# Patient Record
Sex: Male | Born: 2000 | Race: Black or African American | Hispanic: No | Marital: Single | Smoking: Never smoker
Health system: Southern US, Community
[De-identification: ages and names within clinical notes are randomized; demographics above are authoritative.]

## PROBLEM LIST (undated history)

## (undated) DIAGNOSIS — F909 Attention-deficit hyperactivity disorder, unspecified type: Secondary | ICD-10-CM

## (undated) HISTORY — DX: Attention-deficit hyperactivity disorder, unspecified type: F90.9

---
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
## 2020-02-25 ENCOUNTER — Emergency Department (HOSPITAL_COMMUNITY)
Admission: EM | Admit: 2020-02-25 | Discharge: 2020-02-25 | Disposition: A | Discharge status: Home / Self Care | Payer: No Typology Code available for payment source | Attending: Emergency Medicine | Admitting: Emergency Medicine

## 2020-02-25 ENCOUNTER — Emergency Department (HOSPITAL_COMMUNITY): Payer: No Typology Code available for payment source

## 2020-02-25 ENCOUNTER — Encounter (HOSPITAL_COMMUNITY): Payer: Self-pay | Admitting: Emergency Medicine

## 2020-02-25 ENCOUNTER — Other Ambulatory Visit: Payer: Self-pay

## 2020-02-25 DIAGNOSIS — U071 COVID-19: Secondary | ICD-10-CM | POA: Diagnosis not present

## 2020-02-25 DIAGNOSIS — R059 Cough, unspecified: Secondary | ICD-10-CM | POA: Diagnosis present

## 2020-02-25 LAB — SARS CORONAVIRUS 2 BY RT PCR (HOSPITAL ORDER, PERFORMED IN ~~LOC~~ HOSPITAL LAB): SARS Coronavirus 2: POSITIVE — AB

## 2020-02-25 MED ORDER — BENZONATATE 100 MG PO CAPS: 100.0000 mg | ORAL_CAPSULE | Freq: Three times a day (TID) | ORAL | 0 refills | Status: DC

## 2020-02-25 MED ORDER — ONDANSETRON 4 MG PO TBDP: 4.0000 mg | ORAL_TABLET | Freq: Three times a day (TID) | ORAL | 0 refills | Status: DC | PRN

## 2020-02-25 NOTE — Discharge Instructions (Signed)
Your covid  test today was positive. Try to push oral fluids at home, rest. 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  Note and copy of positive test on back. Medications sent to pharmacy to help with symptoms.  Would continue tylenol or motrin for fever and/or body aches. Return here for any new/acute changes.

## 2020-02-25 NOTE — ED Notes (Signed)
The pt has had the vaccine  He has had body aches chills sorethroat headache  His last tylenol was 1400 today tylenol a and o x 4

## 2020-02-25 NOTE — ED Provider Notes (Signed)
[facility] EMERGENCY DEPARTMENT Provider Note   CSN: [id number] Arrival date & time: 02/25/20  1907     History Chief Complaint  Patient presents with  . SOB/Fever/Cough    Jeffrey [name] is a 20 y.o. male.  The history is provided by the patient and medical records.   20 year old male with no significant past medical history presenting to the ED with 3 days of cough, sore throat, intermittent fever, body aches, fatigue, and nausea.  He has not had any vomiting or diarrhea.  He denies any shortness of breath or chest pain.  States he is concerned he has COVID.  He did have an exposure at [employer] darted a new job and person at desk next to him has been sick recently but has not yet been tested.  He is fully vaccinated for COVID-19.  History reviewed. No pertinent past medical history.  There are no problems to display for this patient.   History reviewed. No pertinent surgical history.     No family history on file.  Social History   Tobacco Use  . Smoking status: Never Smoker  . Smokeless tobacco: Never Used  Substance Use Topics  . Alcohol use: Never  . Drug use: Never    Home Medications Prior to Admission medications   Not on File    Allergies    Patient has no known allergies.  Review of Systems   Review of Systems  Constitutional: Positive for fever.  HENT: Positive for congestion and sore throat.   Respiratory: Positive for cough.   Musculoskeletal: Positive for myalgias.  All other systems reviewed and are negative.   Physical Exam Updated Vital Signs BP 119/73   Pulse 64   Temp 98.2 F (36.8 C)   Resp 20   Ht 5\' 11"  (1.803 m)   Wt 85 kg   SpO2 100%   BMI 26.14 kg/m   Physical Exam Vitals and nursing note reviewed.  Constitutional:      Appearance: He is well-developed and well-nourished.  HENT:     Head: Normocephalic and atraumatic.     Mouth/Throat:     Mouth: Oropharynx is clear and moist.     Comments:  Tonsils overall normal in appearance bilaterally without exudate; erythema of posterior oropharynx, uvula midline without evidence of peritonsillar abscess; handling secretions appropriately; no difficulty swallowing or speaking; normal phonation without stridor Eyes:     Extraocular Movements: EOM normal.     Conjunctiva/sclera: Conjunctivae normal.     Pupils: Pupils are equal, round, and reactive to light.  Cardiovascular:     Rate and Rhythm: Normal rate and regular rhythm.     Heart sounds: Normal heart sounds.  Pulmonary:     Effort: Pulmonary effort is normal. No respiratory distress.     Breath sounds: Normal breath sounds. No rhonchi.  Abdominal:     General: Bowel sounds are normal.     Palpations: Abdomen is soft.     Tenderness: There is no abdominal tenderness. There is no rebound.  Musculoskeletal:        General: Normal range of motion.     Cervical back: Normal range of motion.  Skin:    General: Skin is warm and dry.  Neurological:     Mental Status: He is alert and oriented to person, place, and time.  Psychiatric:        Mood and Affect: Mood and affect normal.     ED Results / Procedures / Treatments  Labs (all labs ordered are listed, but only abnormal results are displayed) Labs Reviewed  SARS CORONAVIRUS 2 BY RT PCR (HOSPITAL ORDER, PERFORMED IN [place] [facility]) - Abnormal; Notable for the following components:      Result Value   SARS Coronavirus 2 POSITIVE (*)    All other components within normal limits    EKG None  Radiology DG Chest Portable 1 View  Result Date: 02/25/2020 CLINICAL DATA:  Short of breath, fever, cough EXAM: PORTABLE CHEST 1 VIEW COMPARISON:  None. FINDINGS: Two frontal views of the chest are obtained with the patient rotated toward the right. Cardiac silhouette is unremarkable. No airspace disease, effusion, or pneumothorax. No acute bony abnormalities. IMPRESSION: 1. No acute intrathoracic process. Electronically  Signed   By: Sharlet Salina M.D.   On: 02/25/2020 19:38    Procedures Procedures (including critical care time)  Medications Ordered in ED Medications - No data to display  ED Course  I have reviewed the triage vital signs and the nursing notes.  Pertinent labs & imaging results that were available during my care of the patient were reviewed by me and considered in my medical decision making (see chart for details).    MDM Rules/Calculators/A&P  20 y.o. M here with 3 days of URI symptoms.  Specifically, cough, sore throat, body aches, fever, nausea.  He is afebrile, non-toxic in appearance here.  Lungs are grossly clear without wheezes or rhonchi.  No signs of respiratory distress.  Mild erythema of posterior oropharynx but no tonsillar edema or exudates.  Handling secretions well, no stridor. Abdomen soft, non-tender.  covid test here is positive.  CXR without acute findings.  Results discussed with patient, he acknowledged understanding.  Plan to discharge home with symptomatic care, quarantine precautions.  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  Will return to the ED for any new/acute changes.  Final Clinical Impression(s) / ED Diagnoses Final diagnoses:  COVID-19    Rx / DC Orders ED Discharge Orders         Ordered    benzonatate (TESSALON) 100 MG capsule  Every 8 hours        02/25/20 2300    ondansetron (ZOFRAN ODT) 4 MG disintegrating tablet  Every 8 hours PRN        02/25/20 2300           Garlon Hatchet, PA-C 02/25/20 2310    Lorre Nick, MD 02/27/20 2243

## 2020-02-25 NOTE — ED Triage Notes (Signed)
Patient reports worsening SOB with dry cough and fever/chills for several days .

## 2020-02-27 ENCOUNTER — Other Ambulatory Visit: Payer: Self-pay

## 2020-02-27 ENCOUNTER — Emergency Department (HOSPITAL_COMMUNITY)
Admission: EM | Admit: 2020-02-27 | Discharge: 2020-02-28 | Disposition: A | Discharge status: Home / Self Care | Payer: No Typology Code available for payment source | Attending: Emergency Medicine | Admitting: Emergency Medicine

## 2020-02-27 ENCOUNTER — Encounter (HOSPITAL_COMMUNITY): Payer: Self-pay | Admitting: Pediatrics

## 2020-02-27 DIAGNOSIS — F329 Major depressive disorder, single episode, unspecified: Secondary | ICD-10-CM | POA: Diagnosis not present

## 2020-02-27 DIAGNOSIS — U071 COVID-19: Secondary | ICD-10-CM | POA: Insufficient documentation

## 2020-02-27 DIAGNOSIS — R45851 Suicidal ideations: Secondary | ICD-10-CM | POA: Insufficient documentation

## 2020-02-27 DIAGNOSIS — R059 Cough, unspecified: Secondary | ICD-10-CM | POA: Diagnosis present

## 2020-02-27 DIAGNOSIS — F322 Major depressive disorder, single episode, severe without psychotic features: Secondary | ICD-10-CM | POA: Insufficient documentation

## 2020-02-27 LAB — COMPREHENSIVE METABOLIC PANEL
ALT: 27 U/L (ref 0–44)
AST: 28 U/L (ref 15–41)
Albumin: 4 g/dL (ref 3.5–5.0)
Alkaline Phosphatase: 52 U/L (ref 38–126)
Anion gap: 10 (ref 5–15)
BUN: 14 mg/dL (ref 6–20)
CO2: 27 mmol/L (ref 22–32)
Calcium: 9.6 mg/dL (ref 8.9–10.3)
Chloride: 103 mmol/L (ref 98–111)
Creatinine, Ser: 1.15 mg/dL (ref 0.61–1.24)
GFR, Estimated: >60 mL/min (ref >60)
Glucose, Bld: 94 mg/dL (ref 70–99)
Potassium: 4.1 mmol/L (ref 3.5–5.1)
Sodium: 140 mmol/L (ref 135–145)
Total Bilirubin: 0.8 mg/dL (ref 0.3–1.2)
Total Protein: 6.8 g/dL (ref 6.5–8.1)

## 2020-02-27 LAB — CBC
HCT: 44.7 % (ref 39.0–52.0)
Hemoglobin: 15 g/dL (ref 13.0–17.0)
MCH: 29.2 pg (ref 26.0–34.0)
MCHC: 33.6 g/dL (ref 30.0–36.0)
MCV: 87 fL (ref 80.0–100.0)
Platelets: 220 10*3/uL (ref 150–400)
RBC: 5.14 MIL/uL (ref 4.22–5.81)
RDW: 13.2 % (ref 11.5–15.5)
WBC: 3.6 10*3/uL — ABNORMAL LOW (ref 4.0–10.5)
nRBC: 0 % (ref 0.0–0.2)

## 2020-02-27 LAB — RAPID URINE DRUG SCREEN, HOSP PERFORMED
Amphetamines: NOT DETECTED
Barbiturates: NOT DETECTED
Benzodiazepines: NOT DETECTED
Cocaine: NOT DETECTED
Opiates: NOT DETECTED
Tetrahydrocannabinol: NOT DETECTED

## 2020-02-27 LAB — SALICYLATE LEVEL: Salicylate Lvl: <7 mg/dL — ABNORMAL LOW (ref 7.0–30.0)

## 2020-02-27 LAB — ACETAMINOPHEN LEVEL: Acetaminophen (Tylenol), Serum: <10 ug/mL — ABNORMAL LOW (ref 10–30)

## 2020-02-27 LAB — ETHANOL: Alcohol, Ethyl (B): <10 mg/dL (ref <10)

## 2020-02-27 MED ORDER — HYDROXYZINE HCL 25 MG PO TABS
25.0000 mg | ORAL_TABLET | Freq: Three times a day (TID) | ORAL | Status: DC | PRN
  Administered 2020-02-28: 25 mg via ORAL
  Filled 2020-02-27: qty 1

## 2020-02-27 MED ORDER — ACETAMINOPHEN 325 MG PO TABS: 650.0000 mg | ORAL_TABLET | ORAL | Status: DC | PRN

## 2020-02-27 MED ORDER — FLUOXETINE HCL 10 MG PO CAPS
10.0000 mg | ORAL_CAPSULE | Freq: Every day | ORAL | Status: DC
  Administered 2020-02-27 – 2020-02-28 (×2): 10 mg via ORAL
  Filled 2020-02-27 (×2): qty 1

## 2020-02-27 MED ORDER — TRAZODONE HCL 50 MG PO TABS
50.0000 mg | ORAL_TABLET | Freq: Every day | ORAL | Status: DC
  Administered 2020-02-27: 50 mg via ORAL
  Filled 2020-02-27: qty 1

## 2020-02-27 MED ORDER — ZOLPIDEM TARTRATE 5 MG PO TABS: 5.0000 mg | ORAL_TABLET | Freq: Every evening | ORAL | Status: DC | PRN

## 2020-02-27 MED ORDER — ONDANSETRON HCL 4 MG PO TABS: 4.0000 mg | ORAL_TABLET | Freq: Three times a day (TID) | ORAL | Status: DC | PRN

## 2020-02-27 NOTE — ED Provider Notes (Signed)
[facility] EMERGENCY DEPARTMENT Provider Note   CSN: [id number] Arrival date & time: 02/27/20  1130     History Chief Complaint  Patient presents with  . Suicidal    Jeffrey [name] is a 20 y.o. male.  HPI 20 yo male presents with complaints of suicidal ideation.  Patient reports si over the past two years but states he has not sought help.  He states that his mother told him he needed to come to the hospital today to have an evaluation.  He reports that he has had ideas of harming himself including wrecking a car, stabbing himself, and various other self-harm.  He reports that he thought about wrecking her car 1 time in the past but otherwise does not previously acted on his thoughts.  He reports that he has not felt well for the past couple weeks and had increased congestion and coughing on Friday.  He had a positive COVID test obtained on Friday.  He continues to have some nasal congestion and coughing.  Reports that he has had 2 COVID vaccines but has not had a booster. 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  He has a new job that started on Friday.  He lives with his mother.  He reports that he has used alcohol and smoked in the past but is not a regular user and has not recently used anything History reviewed. No pertinent past medical history.  There are no problems to display for this patient.   History reviewed. No pertinent surgical history.     No family history on file.  Social History   Tobacco Use  . Smoking status: Never Smoker  . Smokeless tobacco: Never Used  Substance Use Topics  . Alcohol use: Never  . Drug use: Never    Home Medications Prior to Admission medications   Medication Sig Start Date End Date Taking? Authorizing Provider  benzonatate (TESSALON) 100 MG capsule Take 1 capsule (100 mg total) by mouth every 8 (eight) hours. 02/25/20   Garlon Hatchet, PA-C  ondansetron (ZOFRAN ODT) 4 MG disintegrating tablet  Take 1 tablet (4 mg total) by mouth every 8 (eight) hours as needed for nausea. 02/25/20   Garlon Hatchet, PA-C    Allergies    Patient has no known allergies.  Review of Systems   Review of Systems  All other systems reviewed and are negative.   Physical Exam Updated Vital Signs BP 121/89 (BP Location: Left Arm)   Pulse 85   Temp 98.2 F (36.8 C) (Oral)   Resp 16   Ht 1.803 m (5\' 11" )   Wt 77.1 kg   SpO2 98%   BMI 23.71 kg/m   Physical Exam Vitals and nursing note reviewed.  Constitutional:      Appearance: Normal appearance.  HENT:     Head: Normocephalic.     Right Ear: External ear normal.     Left Ear: External ear normal.     Nose: Nose normal.     Mouth/Throat:     Mouth: Mucous membranes are moist.  Eyes:     Extraocular Movements: Extraocular movements intact.     Pupils: Pupils are equal, round, and reactive to light.  Cardiovascular:     Rate and Rhythm: Normal rate and regular rhythm.     Pulses: Normal pulses.  Pulmonary:     Effort: Pulmonary effort is normal.     Breath sounds: Normal breath sounds.  Abdominal:  General: Abdomen is flat.     Palpations: Abdomen is soft.  Musculoskeletal:        General: No swelling or tenderness. Normal range of motion.     Cervical back: Normal range of motion.  Skin:    General: Skin is warm and dry.     Capillary Refill: Capillary refill takes less than 2 seconds.  Neurological:     General: No focal deficit present.     Mental Status: He is alert and oriented to person, place, and time.     Cranial Nerves: No cranial nerve deficit.     Motor: No weakness.  Psychiatric:        Attention and Perception: Attention normal.        Mood and Affect: Affect is flat.        Speech: Speech normal.        Thought Content: Thought content includes suicidal ideation.        Cognition and Memory: Cognition normal.        Judgment: Judgment normal.     ED Results / Procedures / Treatments   Labs (all labs  ordered are listed, but only abnormal results are displayed) Labs Reviewed  SALICYLATE LEVEL - Abnormal; Notable for the following components:      Result Value   Salicylate Lvl <7.0 (*)    All other components within normal limits  ACETAMINOPHEN LEVEL - Abnormal; Notable for the following components:   Acetaminophen (Tylenol), Serum <10 (*)    All other components within normal limits  CBC - Abnormal; Notable for the following components:   WBC 3.6 (*)    All other components within normal limits  COMPREHENSIVE METABOLIC PANEL  ETHANOL    EKG None  Radiology DG Chest Portable 1 View  Result Date: 02/25/2020 CLINICAL DATA:  Short of breath, fever, cough EXAM: PORTABLE CHEST 1 VIEW COMPARISON:  None. FINDINGS: Two frontal views of the chest are obtained with the patient rotated toward the right. Cardiac silhouette is unremarkable. No airspace disease, effusion, or pneumothorax. No acute bony abnormalities. IMPRESSION: 1. No acute intrathoracic process. Electronically Signed   By: Sharlet Salina M.D.   On: 02/25/2020 19:38    Procedures Procedures (including critical care time)  Medications Ordered in ED Medications - No data to display  ED Course  I have reviewed the triage vital signs and the nursing notes.  Pertinent labs & imaging results that were available during my care of the patient were reviewed by me and considered in my medical decision making (see chart for details).    MDM Rules/Calculators/A&P                         Patient with known COVID which appears stable He has suicidal thoughts.  Plan behavioral health assessment.  I would not IVC him at this time as he is here voluntarily. Labs reviewed, vital signs reviewed and patient is medically cleared.   Final Clinical Impression(s) / ED Diagnoses Final diagnoses:  COVID-19  Suicidal ideation    Rx / DC Orders ED Discharge Orders    None       Margarita Grizzle, MD 02/27/20 1441

## 2020-02-27 NOTE — BH Assessment (Signed)
Tele Assessment Note   Patient Name: Jeffrey [name] MRN: [id number] Referring Physician: EDP Location of Patient: [facility] Location of Provider: Behavioral Health TTS Department  Jeffrey [name] is a 20 y.o. male who presented to [facility] on a voluntary basis with complaint of suicidal ideation, despondency, and other symptoms.  Pt lives in [place] with his mother, and he recently took a leave of absence from [employer] due to despondency.  Pt is about to start work.  Pt has not been evaluated by TTS before, and he does not have an outpatient psychiatrist.  Pt reported that he has been depressed for years, but that over the last month, he has noticed an increase in suicidal ideation  Pt identified social stressors and COVID as triggers for worsening mood.  Pt endorsed suicidal ideation with various plans (poisoning self, intentionally crashing car, and cutting self with a knife); despondency; increased irritability/anger outbursts; isolation; hopelessness and worthlessness.  Pt admitted to episodic use of marijuana.  Pt denied hallucination and self-injurious behavior.  Pt stated that he is concerned about his increased anger -- he stated that he scared himself today by having an ''outburst'' outside of the home.  Pt stated that he has never been treated inpatient before.  Pt stated that he often has the urge to self-harm, but he has not acted on it.  When asked what help he wants, Pt stated that he thought he might need inpatient, but now he is not sure.    During assessment, Pt presented as alert and oriented.  He had good eye contact and was cooperative.  Pt was dressed in scrubs, and he appeared appropriately groomed.  Pt's demeanor was calm.  Pt's mood was depressed.  Affect was blunted.  Pt's speech was normal in rate, rhythm, and volume.  Thought processes were within normal range, and thought content was logical and goal-oriented.  There was no evidence of delusion.  Pt's memory and concentration were  intact.  Insight, judgment, and impulse control were fair.  Consulted with Roselie Skinner, NP who determined that Pt meets inpatient criteria.  Diagnosis: Major Depressive Disorder, Single Ep, Severe w/o psychotic features  Past Medical History: History reviewed. No pertinent past medical history.  History reviewed. No pertinent surgical history.  Family History: No family history on file.  Social History:  reports that he has never smoked. He has never used smokeless tobacco. He reports that he does not drink alcohol and does not use drugs.  Additional Social History:     CIWA: CIWA-Ar BP: 121/89 Pulse Rate: 85 COWS:    Allergies: No Known Allergies  Home Medications: (Not in a hospital admission)   OB/GYN Status:  No LMP for male patient.  General Assessment Data Location of Assessment: [facility] ED Date Telepsych consult ordered in [facility]: 02/27/20 Marital status: Single                                                            Disposition:     This service was provided via telemedicine using a 2-way, interactive audio and video technology.  Names of all persons participating in this telemedicine service and their role in this encounter. Name: Jeffrey [name] Role: Patient             Earline Mayotte 02/27/2020 4:20 PM

## 2020-02-27 NOTE — ED Triage Notes (Signed)
Stated he has some intention of harming himself and is worsening. Stated tested + for Covid few days ago,

## 2020-02-27 NOTE — ED Notes (Signed)
Pt belongings in triage

## 2020-02-28 MED ORDER — FLUOXETINE HCL 10 MG PO CAPS: 10.0000 mg | ORAL_CAPSULE | Freq: Every day | ORAL | 0 refills | Status: DC

## 2020-02-28 MED ORDER — HYDROXYZINE HCL 25 MG PO TABS: 25.0000 mg | ORAL_TABLET | Freq: Three times a day (TID) | ORAL | 0 refills | Status: DC | PRN

## 2020-02-28 NOTE — Progress Notes (Signed)
Patient seen by TTS counselor (see TTS note) and briefly by me. Patient has been started on psychotropic medications in the ED and demonstrating improved mood. He denies any SI/HI/AVH and contracts for safety. He is requesting discharge home. Prozac and Vistaril rx sent to [facility] on [address]. Patient reports already having a counseling appointment in place. Referral information for psychiatry placed in chart. Patient is psych cleared for discharge.

## 2020-02-28 NOTE — ED Notes (Signed)
TTS at bedside. 

## 2020-02-28 NOTE — ED Notes (Addendum)
Mom Oland Arquette) [phone]   updated on current assessment and plan for inpatient care.

## 2020-02-28 NOTE — ED Provider Notes (Signed)
6:41 PM notified by RN that patient has been cleared for discharge by psychiatry, reviewed patient notes. Patient cleared for discharge to home. He has been prescribed Prozac and Vistaril and these are waiting at the pharmacy.  BP 110/66 (BP Location: Left Arm)   Pulse 66   Temp 98.6 F (37 C) (Oral)   Resp 16   Ht 5\' 11"  (1.803 m)   Wt 77.1 kg   SpO2 98%   BMI 23.71 kg/m     , PA-C 02/28/20 1842    03/01/20, MD 02/28/20 2255

## 2020-02-28 NOTE — BH Assessment (Signed)
Patient was seen this date by this writer to evaluate current mental health status. Patient is observed in scrubs laying on stretcher . Patient is alert and oriented x5. Patient advised Clinical research associate that he feels more level headed than when first coming to the ED. He stated that he feels the medications they gave him are working. He advised Clinical research associate that he is not having any intrusive thought of SI/ HI or  AVH presently. 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 Writer did speak with mother Shannon Kirkendall [phone]) who did not have any concerns with her son returning home , mother stated "that she just wants him to be well, don't want him to feel bad"and she is "not going to pressure him". Mom did say she thinks his THC smoke has really changes his cognitive ability but can't say for sure.  Per Mother individual therapy already set up after discharge with [facility] [place]. Case was staffed with Marciano Sequin  NP who recommended discharge with resources for individual therapy and medication management , SW notified .

## 2020-02-28 NOTE — ED Notes (Signed)
+  COVID Breakfast Order Placed

## 2020-02-28 NOTE — ED Notes (Signed)
Reached out to EDP to notify pt is psych cleared and for DC.

## 2020-02-28 NOTE — Progress Notes (Signed)
Pt meets inpatient criteria. Referral information has been sent to the following hospitals for review:  [facility]  [facility]  [facility]  [facility]  [facility]  [facility]  [facility]  [facility]  [facility]  [facility]     Disposition will continue to follow.    Wells Guiles, MSW, LCSW, LCAS Clinical Social Worker II Disposition CSW [phone]

## 2020-03-23 ENCOUNTER — Emergency Department (HOSPITAL_COMMUNITY)
Admission: EM | Admit: 2020-03-23 | Discharge: 2020-03-23 | Disposition: A | Discharge status: Other Acute Inpatient Hospital | Payer: No Typology Code available for payment source | Attending: Emergency Medicine | Admitting: Emergency Medicine

## 2020-03-23 ENCOUNTER — Encounter (HOSPITAL_COMMUNITY): Payer: Self-pay | Admitting: Emergency Medicine

## 2020-03-23 ENCOUNTER — Encounter (HOSPITAL_COMMUNITY): Payer: Self-pay | Admitting: Psychiatry

## 2020-03-23 ENCOUNTER — Inpatient Hospital Stay (HOSPITAL_COMMUNITY)
Admission: RE | Admit: 2020-03-23 | Discharge: 2020-03-28 | DRG: 885 | Disposition: A | Discharge status: Home / Self Care | Payer: No Typology Code available for payment source | Attending: Emergency Medicine | Admitting: Emergency Medicine

## 2020-03-23 DIAGNOSIS — T43622A Poisoning by amphetamines, intentional self-harm, initial encounter: Secondary | ICD-10-CM | POA: Diagnosis present

## 2020-03-23 DIAGNOSIS — F332 Major depressive disorder, recurrent severe without psychotic features: Secondary | ICD-10-CM | POA: Diagnosis present

## 2020-03-23 DIAGNOSIS — Z9151 Personal history of suicidal behavior: Secondary | ICD-10-CM

## 2020-03-23 DIAGNOSIS — F32A Depression, unspecified: Secondary | ICD-10-CM

## 2020-03-23 DIAGNOSIS — F322 Major depressive disorder, single episode, severe without psychotic features: Secondary | ICD-10-CM | POA: Insufficient documentation

## 2020-03-23 DIAGNOSIS — R45851 Suicidal ideations: Secondary | ICD-10-CM | POA: Diagnosis not present

## 2020-03-23 DIAGNOSIS — T467X2A Poisoning by peripheral vasodilators, intentional self-harm, initial encounter: Secondary | ICD-10-CM | POA: Diagnosis present

## 2020-03-23 DIAGNOSIS — F339 Major depressive disorder, recurrent, unspecified: Secondary | ICD-10-CM | POA: Diagnosis present

## 2020-03-23 DIAGNOSIS — F29 Unspecified psychosis not due to a substance or known physiological condition: Secondary | ICD-10-CM | POA: Diagnosis present

## 2020-03-23 DIAGNOSIS — F1290 Cannabis use, unspecified, uncomplicated: Secondary | ICD-10-CM | POA: Diagnosis present

## 2020-03-23 DIAGNOSIS — R519 Headache, unspecified: Secondary | ICD-10-CM | POA: Diagnosis present

## 2020-03-23 DIAGNOSIS — T1491XA Suicide attempt, initial encounter: Secondary | ICD-10-CM | POA: Diagnosis present

## 2020-03-23 LAB — COMPREHENSIVE METABOLIC PANEL
ALT: 33 U/L (ref 0–44)
AST: 31 U/L (ref 15–41)
Albumin: 4.4 g/dL (ref 3.5–5.0)
Alkaline Phosphatase: 62 U/L (ref 38–126)
Anion gap: 9 (ref 5–15)
BUN: 13 mg/dL (ref 6–20)
CO2: 27 mmol/L (ref 22–32)
Calcium: 9.9 mg/dL (ref 8.9–10.3)
Chloride: 101 mmol/L (ref 98–111)
Creatinine, Ser: 1.13 mg/dL (ref 0.61–1.24)
GFR, Estimated: >60 mL/min (ref >60)
Glucose, Bld: 93 mg/dL (ref 70–99)
Potassium: 4.1 mmol/L (ref 3.5–5.1)
Sodium: 137 mmol/L (ref 135–145)
Total Bilirubin: 1.2 mg/dL (ref 0.3–1.2)
Total Protein: 7.4 g/dL (ref 6.5–8.1)

## 2020-03-23 LAB — RAPID URINE DRUG SCREEN, HOSP PERFORMED
Amphetamines: POSITIVE — AB
Barbiturates: NOT DETECTED
Benzodiazepines: NOT DETECTED
Cocaine: NOT DETECTED
Opiates: NOT DETECTED
Tetrahydrocannabinol: NOT DETECTED

## 2020-03-23 LAB — CBC
HCT: 49 % (ref 39.0–52.0)
Hemoglobin: 16.4 g/dL (ref 13.0–17.0)
MCH: 29.2 pg (ref 26.0–34.0)
MCHC: 33.5 g/dL (ref 30.0–36.0)
MCV: 87.3 fL (ref 80.0–100.0)
Platelets: 167 10*3/uL (ref 150–400)
RBC: 5.61 MIL/uL (ref 4.22–5.81)
RDW: 13.6 % (ref 11.5–15.5)
WBC: 5.1 10*3/uL (ref 4.0–10.5)
nRBC: 0 % (ref 0.0–0.2)

## 2020-03-23 MED ORDER — ADULT MULTIVITAMIN W/MINERALS CH
ORAL_TABLET | Freq: Every day | ORAL | Status: DC
  Administered 2020-03-23: 1 via ORAL
  Filled 2020-03-23: qty 1

## 2020-03-23 MED ORDER — ONDANSETRON HCL 4 MG PO TABS: 4.0000 mg | ORAL_TABLET | Freq: Three times a day (TID) | ORAL | Status: DC | PRN

## 2020-03-23 MED ORDER — ZOLPIDEM TARTRATE 5 MG PO TABS: 5.0000 mg | ORAL_TABLET | Freq: Every evening | ORAL | Status: DC | PRN

## 2020-03-23 MED ORDER — FLUOXETINE HCL 10 MG PO CAPS
10.0000 mg | ORAL_CAPSULE | Freq: Every day | ORAL | Status: DC
  Administered 2020-03-23: 10 mg via ORAL
  Filled 2020-03-23: qty 1

## 2020-03-23 MED ORDER — HYDROXYZINE HCL 25 MG PO TABS: 25.0000 mg | ORAL_TABLET | Freq: Three times a day (TID) | ORAL | Status: DC | PRN

## 2020-03-23 MED ORDER — IBUPROFEN 400 MG PO TABS: 600.0000 mg | ORAL_TABLET | Freq: Three times a day (TID) | ORAL | Status: DC | PRN

## 2020-03-23 MED ORDER — ALUM & MAG HYDROXIDE-SIMETH 200-200-20 MG/5ML PO SUSP: 30.0000 mL | Freq: Four times a day (QID) | ORAL | Status: DC | PRN
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
## 2020-03-23 NOTE — BH Assessment (Signed)
Clinician to check back with Maralyn Sago, RN in fifteen minutes.  Redmond Pulling, MS, [facility], [facility] Triage Specialist [phone]

## 2020-03-23 NOTE — ED Notes (Signed)
Per previous RN, pt wanded, belongings placed in locker

## 2020-03-23 NOTE — ED Provider Notes (Signed)
[facility] EMERGENCY DEPARTMENT Provider Note   CSN: [id number] Arrival date & time: 03/23/20  1522     History Chief Complaint  Patient presents with  . Suicidal    Jeffrey [name] is a 20 y.o. male.  Pt presents to the ED today with SI.  Pt said he took 2 Adderall pills and 6 niacin tabs.  The pt said he wanted to stab himself today.  He called EMS today due to these thoughts.  He was positive for Covid last months, but those sx are gone now.        History reviewed. No pertinent past medical history.  Patient Active Problem List   Diagnosis Date Noted  . Current severe episode of major depressive disorder without psychotic features without prior episode [facility])     History reviewed. No pertinent surgical history.     No family history on file.  Social History   Tobacco Use  . Smoking status: Never Smoker  . Smokeless tobacco: Never Used  Substance Use Topics  . Alcohol use: Never  . Drug use: Never    Home Medications Prior to Admission medications   Medication Sig Start Date End Date Taking? Authorizing Provider  benzonatate (TESSALON) 100 MG capsule Take 1 capsule (100 mg total) by mouth every 8 (eight) hours. Patient not taking: No sig reported 02/25/20   Garlon Hatchet, PA-C  FLUoxetine (PROZAC) 10 MG capsule Take 1 capsule (10 mg total) by mouth daily. 02/29/20   Aldean Baker, NP  hydrOXYzine (ATARAX/VISTARIL) 25 MG tablet Take 1 tablet (25 mg total) by mouth 3 (three) times daily as needed for anxiety. 02/28/20   Aldean Baker, NP  Multiple Vitamin (MULTIVITAMIN ADULT PO) Take 1 tablet by mouth daily.    [provider]  NIACIN PO Take 1 tablet by mouth daily.    [provider]  ondansetron (ZOFRAN ODT) 4 MG disintegrating tablet Take 1 tablet (4 mg total) by mouth every 8 (eight) hours as needed for nausea. Patient not taking: No sig reported 02/25/20   Garlon Hatchet, PA-C    Allergies    Patient has no known  allergies.  Review of Systems   Review of Systems  Psychiatric/Behavioral: Positive for suicidal ideas.  All other systems reviewed and are negative.   Physical Exam Updated Vital Signs BP 112/74 (BP Location: Right Arm)   Pulse 84   Temp 98 F (36.7 C) (Oral)   Resp 16   SpO2 97%   Physical Exam Vitals and nursing note reviewed.  Constitutional:      Appearance: Normal appearance.  HENT:     Head: Normocephalic and atraumatic.     Right Ear: External ear normal.     Left Ear: External ear normal.     Nose: Nose normal.     Mouth/Throat:     Mouth: Mucous membranes are moist.     Pharynx: Oropharynx is clear.  Eyes:     Extraocular Movements: Extraocular movements intact.     Conjunctiva/sclera: Conjunctivae normal.     Pupils: Pupils are equal, round, and reactive to light.  Cardiovascular:     Rate and Rhythm: Normal rate and regular rhythm.     Pulses: Normal pulses.     Heart sounds: Normal heart sounds.  Pulmonary:     Effort: Pulmonary effort is normal.     Breath sounds: Normal breath sounds.  Abdominal:     General: Abdomen is flat. Bowel sounds are  normal.     Palpations: Abdomen is soft.  Musculoskeletal:        General: Normal range of motion.     Cervical back: Normal range of motion and neck supple.  Skin:    General: Skin is warm.     Capillary Refill: Capillary refill takes less than 2 seconds.  Neurological:     General: No focal deficit present.     Mental Status: He is alert and oriented to person, place, and time.  Psychiatric:        Mood and Affect: Mood normal.        Behavior: Behavior normal.        Thought Content: Thought content includes suicidal ideation. Thought content includes suicidal plan.        Judgment: Judgment normal.     ED Results / Procedures / Treatments   Labs (all labs ordered are listed, but only abnormal results are displayed) Labs Reviewed  RAPID URINE DRUG SCREEN, HOSP PERFORMED - Abnormal; Notable for the  following components:      Result Value   Amphetamines POSITIVE (*)    All other components within normal limits  COMPREHENSIVE METABOLIC PANEL  CBC  ETHANOL  SALICYLATE LEVEL  ACETAMINOPHEN LEVEL    EKG None  Radiology No results found.  Procedures Procedures   Medications Ordered in ED Medications  ibuprofen (ADVIL) tablet 600 mg (has no administration in time range)  zolpidem (AMBIEN) tablet 5 mg (has no administration in time range)  ondansetron (ZOFRAN) tablet 4 mg (has no administration in time range)  alum & mag hydroxide-simeth (MAALOX/MYLANTA) 200-200-20 MG/5ML suspension 30 mL (has no administration in time range)  FLUoxetine (PROZAC) capsule 10 mg (has no administration in time range)  hydrOXYzine (ATARAX/VISTARIL) tablet 25 mg (has no administration in time range)  multivitamin with minerals tablet (has no administration in time range)    ED Course  I have reviewed the triage vital signs and the nursing notes.  Pertinent labs & imaging results that were available during my care of the patient were reviewed by me and considered in my medical decision making (see chart for details).    MDM Rules/Calculators/A&P                          Pt is medically clear for TTS eval.  The amount of pills he took will not cause any medical issues.  Disposition per TTS.  Final Clinical Impression(s) / ED Diagnoses Final diagnoses:  Suicidal ideation    Rx / DC Orders ED Discharge Orders    None       Jacalyn Lefevre, MD 03/23/20 1836

## 2020-03-23 NOTE — ED Notes (Signed)
Patient BIB EMS for suicidal ideations, patient states he took 2x adderall and 6x niacin tablets. Also states he picked up a knife several times today but put it back down without injuring himself. Patient is calm and cooperative.

## 2020-03-23 NOTE — BH Assessment (Signed)
Pt consented for clinician to talk to his mother Colburn Asper, [phone]) to gather additional information. Per mother the pt needs to go back to live at his fathers, he doesn't do what he's supposed to do. Per mother, when the pt drops the ball he has a breakdown; he didn't make it to a job interview, he messed up at [employer]. Per mother the pt had a good part time job at [employer] last year while going to school. Pt's mother reported, she got him a new car with a low car payment for the pt, he had four jobs he quit on a whim. Per mother, the pt cant come to her house after discharge, she wants her keys back, she wants the officer that brought him to the ED to take him to his father's house. Clinician expressed she does not know the officer that brought him the ED thus can not tell someone to take the pt to his father's house. Pt's mother reported, she emailed the pt's therapist Gloris Manchester Abernathy, LCSW) to what was going on. Per mother, this happened in January 2022. Pt's mother reported, she is not sure if the pt is crafty or very ill, pt has been forgetful.   *Pt's mother does not want the pt to know the content of our phone conversation.*   Redmond Pulling, MS, [facility], [facility] Triage Specialist [phone]

## 2020-03-23 NOTE — ED Notes (Signed)
Safe Transport arranged

## 2020-03-24 ENCOUNTER — Other Ambulatory Visit: Payer: Self-pay

## 2020-03-24 DIAGNOSIS — F332 Major depressive disorder, recurrent severe without psychotic features: Secondary | ICD-10-CM | POA: Diagnosis present

## 2020-03-24 DIAGNOSIS — F32A Depression, unspecified: Secondary | ICD-10-CM

## 2020-03-24 DIAGNOSIS — F29 Unspecified psychosis not due to a substance or known physiological condition: Secondary | ICD-10-CM

## 2020-03-24 DIAGNOSIS — F339 Major depressive disorder, recurrent, unspecified: Secondary | ICD-10-CM | POA: Diagnosis present

## 2020-03-24 LAB — LIPID PANEL
Cholesterol: 119 mg/dL (ref 0–200)
HDL: 61 mg/dL (ref >40)
LDL Cholesterol: 52 mg/dL (ref 0–99)
Total CHOL/HDL Ratio: 2 RATIO
Triglycerides: 29 mg/dL (ref <150)
VLDL: 6 mg/dL (ref 0–40)

## 2020-03-24 LAB — TSH: TSH: 2.33 u[IU]/mL (ref 0.350–4.500)

## 2020-03-24 LAB — HEMOGLOBIN A1C
Hgb A1c MFr Bld: 5.2 % (ref 4.8–5.6)
Mean Plasma Glucose: 102.54 mg/dL

## 2020-03-24 MED ORDER — ADULT MULTIVITAMIN W/MINERALS CH
1.0000 | ORAL_TABLET | Freq: Every day | ORAL | Status: DC
  Administered 2020-03-24 – 2020-03-28 (×5): 1 via ORAL
  Filled 2020-03-24 (×7): qty 1

## 2020-03-24 MED ORDER — HYDROXYZINE HCL 25 MG PO TABS
25.0000 mg | ORAL_TABLET | Freq: Three times a day (TID) | ORAL | Status: DC | PRN
  Administered 2020-03-24 – 2020-03-27 (×6): 25 mg via ORAL
  Filled 2020-03-24 (×6): qty 1

## 2020-03-24 MED ORDER — ACETAMINOPHEN 325 MG PO TABS: 650.0000 mg | ORAL_TABLET | Freq: Four times a day (QID) | ORAL | Status: DC | PRN

## 2020-03-24 MED ORDER — ALUM & MAG HYDROXIDE-SIMETH 200-200-20 MG/5ML PO SUSP
30.0000 mL | ORAL | Status: DC | PRN
  Filled 2020-03-24: qty 30

## 2020-03-24 MED ORDER — RISPERIDONE 1 MG PO TABS
1.0000 mg | ORAL_TABLET | Freq: Every day | ORAL | Status: DC
  Administered 2020-03-24 – 2020-03-27 (×4): 1 mg via ORAL
  Filled 2020-03-24 (×6): qty 1

## 2020-03-24 MED ORDER — TRAZODONE HCL 50 MG PO TABS
50.0000 mg | ORAL_TABLET | Freq: Every evening | ORAL | Status: DC | PRN
  Administered 2020-03-24 – 2020-03-25 (×2): 50 mg via ORAL
  Filled 2020-03-24 (×2): qty 1

## 2020-03-24 MED ORDER — FLUOXETINE HCL 10 MG PO CAPS
10.0000 mg | ORAL_CAPSULE | Freq: Every day | ORAL | Status: DC
  Administered 2020-03-24 – 2020-03-28 (×5): 10 mg via ORAL
  Filled 2020-03-24 (×7): qty 1

## 2020-03-24 MED ORDER — MAGNESIUM HYDROXIDE 400 MG/5ML PO SUSP
30.0000 mL | Freq: Every day | ORAL | Status: DC | PRN
  Administered 2020-03-27: 30 mL via ORAL
  Filled 2020-03-24: qty 30

## 2020-03-24 NOTE — Tx Team (Signed)
Initial Treatment Plan 03/24/2020 12:15 AM [name]    PATIENT STRESSORS: Educational concerns Financial difficulties Medication change or noncompliance Occupational concerns   PATIENT STRENGTHS: Average or above average intelligence Capable of independent living Motivation for treatment/growth Supportive family/friends   PATIENT IDENTIFIED PROBLEMS: Suicidal ideation - OD adderall and niacin  Depression  Medication non-compliance  (wants to work on cognitive focus and controlling impulsivity)               DISCHARGE CRITERIA:  Adequate post-discharge living arrangements Improved stabilization in mood, thinking, and/or behavior Motivation to continue treatment in a less acute level of care Verbal commitment to aftercare and medication compliance  PRELIMINARY DISCHARGE PLAN: Attend aftercare/continuing care group Outpatient therapy Return to previous living arrangement  PATIENT/FAMILY INVOLVEMENT: This treatment plan has been presented to and reviewed with the patient, Jeffrey [name], and/or family member.  The patient and family have been given the opportunity to ask questions and make suggestions.  Victorino December, RN 03/24/2020, 12:15 AM
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
## 2020-03-24 NOTE — Progress Notes (Signed)
   03/24/20 0630  Vital Signs  Temp 98 F (36.7 C)  Temp Source Oral  Pulse Rate 91  Pulse Rate Source Monitor  Resp 18  BP 100/72  BP Method Automatic  Oxygen Therapy  SpO2 100 %   D: Patient admitted to some SI. Patient reported to staff that he wanted to hurt himself after an upsetting phone call with his mother. Pt. Was asked to come out of his room and to sit in open areas where staff could see him. Pt. Was compliant. 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 Talked about working on other coping skills, such as removing self from irritating stimuli.  A:  Patient took scheduled medicine.  Vistaril helped ease pt's anxiety. Support and encouragement provided Routine safety checks conducted every 15 minutes. Patient  Informed to notify staff with any concerns.   R: Safety maintained.

## 2020-03-24 NOTE — BHH Counselor (Signed)
LCSW Group Therapy Note 03/24/2020 1:15pm  Type of Therapy and Topic:  Group Therapy:  Setting Goals  Participation Level:  Active  Description of Group: In this process group, patients discussed using strengths to work toward goals and address challenges.  Patients identified two positive things about themselves and one goal they were working on.  Patients were given the opportunity to share openly and support each other's plan for self-empowerment.  The group discussed the value of gratitude and were encouraged to have a daily reflection of positive characteristics or circumstances.  Patients were encouraged to identify a plan to utilize their strengths to work on current challenges and goals.  Therapeutic Goals 1. Patient will verbalize personal strengths/positive qualities and relate how these can assist with achieving desired personal goals 2. Patients will verbalize affirmation of peers plans for personal change and goal setting 3. Patients will explore the value of gratitude and positive focus as related to successful achievement of goals 4. Patients will verbalize a plan for regular reinforcement of personal positive qualities and circumstances.   Therapeutic Modalities Cognitive Behavioral Therapy Motivational Interviewing 
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
## 2020-03-24 NOTE — Progress Notes (Signed)
[name] Progress Note  Jeffrey [name]  MRN:  [id number]  03/25/20  Chief Complaint: suicide attempt  Subjective:  Jeffrey [name] is a 20 y.o. male with no known past psychiatric history, who was initially admitted for inpatient psychiatric hospitalization on 03/23/2020 after a suicide attempt via ingestion of Niacin and Adderall. On admission, he endorsed worsening depressive symptoms over the last 2 months and reported new onset of AVH, paranoia, and ideas of reference in the year preceding admission. He admitted to use of [facility] from November 2021 into January 2022. The patient is currently on Hospital Day 2.   Chart Review from last 24 hours:  The patient's chart was reviewed and nursing notes were reviewed. The patient's case was discussed in multidisciplinary team meeting. Per nursing notes, he has appeared depressed and reported SI on day shift with thoughts of hurting himself after talking to his mother on the phone. He was able to talk to staff about his SI and maintain his safety on the unit. He took PRN Vistaril for anxiety and anxiety symptoms improved. He participated in social work group. Per [facility] he was compliant with scheduled medications and did require Vistaril X2 yesterday for anxiety and Trazodone X1 for sleep.  Information Obtained Today During Patient Interview: The patient was seen and evaluated on the unit. On assessment today the patient reports that he has tolerated the start of medications without noted side-effects. He states his mood is "fair" and he reports feeling "hypersexual" thoughts today. He denies having manic or hypomanic symptoms, paranoia on the unit, first rank symptoms, or ideas of reference on the unit. He denies any AVH today. He states he has passive SI without intent or plan and states he can distract himself when he has these thoughts. He can contract for safety on the unit. He denies HI. He reports adequate sleep and appetite. He voices no physical complaints  today.  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 The direct patient care time included face-to-face time with the patient, reviewing the patient's chart, communicating with other professionals, and coordinating care. Greater than 50% of this time was spent in counseling or coordinating care with the patient regarding goals of hospitalization, psycho-education, and discharge planning needs.  Past Psychiatric History:  See admission H&P  Past Medical History: Denied   Past Surgical History: Denied  Family History: See admission H&P  Family Psychiatric  History: mother with possible bipolar sx and ADD; brother with ADD  Social History:  Social History   Substance and Sexual Activity  Alcohol Use Yes   Comment: once a week 1-2 glasses     Social History   Substance and Sexual Activity  Drug Use Not Currently  . Types: Marijuana   Comment: last use marijuana - over a month ago    Social History   Socioeconomic History  . Marital status: Single    Spouse name: Not on file  . Number of children: Not on file  . Years of education: Not on file  . Highest education level: Not on file  Occupational History  . Not on file  Tobacco Use  . Smoking status: Never Smoker  . Smokeless  tobacco: Never Used  Substance and Sexual Activity  . Alcohol use: Yes    Comment: once a week 1-2 glasses  . Drug use: Not Currently    Types: Marijuana    Comment: last use marijuana - over a month ago  . Sexual activity: Not Currently  Other Topics Concern  . Not on file  Social History Narrative  . Not on file   Social  Determinants of Health   Financial Resource Strain: Not on file  Food Insecurity: Not on file  Transportation Needs: Not on file  Physical Activity: Not on file  Stress: Not on file  Social Connections: Not on file   Sleep: Good  Appetite:  Good  Current Medications: Current Facility-Administered Medications  Medication Dose Route Frequency Provider Last Rate Last Admin  . acetaminophen (TYLENOL) tablet 650 mg  650 mg Oral Q6H PRN Nwoko, Agnes I, NP      . alum & mag hydroxide-simeth (MAALOX/MYLANTA) 200-200-20 MG/5ML suspension 30 mL  30 mL Oral Q4H PRN Nwoko, Agnes I, NP      . FLUoxetine (PROZAC) capsule 10 mg  10 mg Oral Daily Lindon Romp A, NP   10 mg at 03/25/20 0814  . hydrOXYzine (ATARAX/VISTARIL) tablet 25 mg  25 mg Oral TID PRN Rozetta Nunnery, NP   25 mg at 03/24/20 2143  . magnesium hydroxide (MILK OF MAGNESIA) suspension 30 mL  30 mL Oral Daily PRN Lindell Spar I, NP      . multivitamin with minerals tablet 1 tablet  1 tablet Oral Daily Lindon Romp A, NP   1 tablet at 03/25/20 0814  . risperiDONE (RISPERDAL) tablet 1 mg  1 mg Oral QHS Nwoko, Agnes I, NP   1 mg at 03/24/20 2143  . traZODone (DESYREL) tablet 50 mg  50 mg Oral QHS PRN Lindell Spar I, NP   50 mg at 03/24/20 2143    Lab Results:  Results for orders placed or performed during the hospital encounter of 03/23/20 (from the past 48 hour(s))  Hemoglobin A1c     Status: None   Collection Time: 03/24/20  6:37 AM  Result Value Ref Range   Hgb A1c MFr Bld 5.2 4.8 - 5.6 %    Comment: (NOTE) Pre diabetes:          5.7%-6.4%  Diabetes:              >6.4%  Glycemic control for   <7.0% adults with diabetes    Mean Plasma Glucose 102.54 mg/dL    Comment: Performed at [facility], [place] [address], [place], [place] [address]  Lipid panel     Status: None   Collection Time: 03/24/20  6:37 AM  Result Value Ref Range   Cholesterol 119 0 - 200 mg/dL   Triglycerides 29 <150 mg/dL   HDL 61 >40 mg/dL   Total  CHOL/HDL Ratio 2.0 RATIO   VLDL 6 0 - 40 mg/dL   LDL Cholesterol 52 0 - 99 mg/dL    Comment:        Total Cholesterol/HDL:CHD Risk Coronary Heart Disease Risk Table                     Men   Women  1/2 Average Risk   3.4   3.3  Average Risk       5.0   4.4  2 X Average Risk   9.6   7.1  3 X Average Risk  23.4   11.0        Use the calculated Patient Ratio above and the CHD Risk Table to determine the patient's CHD Risk.        ATP III CLASSIFICATION (LDL):  <100     mg/dL   Optimal  100-129  mg/dL   Near or Above                    Optimal  130-159  mg/dL   Borderline  160-189  mg/dL   High  >190     mg/dL   Very High Performed at [place] [address], [place], [place] [address]   TSH     Status: None   Collection Time: 03/24/20  6:37 AM  Result Value Ref Range   TSH 2.330 0.350 - 4.500 uIU/mL    Comment: Performed by a 3rd Generation assay with a functional sensitivity of <=0.01 uIU/mL. Performed at [facility], [place] [address] [place], [place] [address]   HIV Antibody (routine testing w rflx)     Status: None   Collection Time: 03/25/20  7:16 AM  Result Value Ref Range   HIV Screen 4th Generation wRfx Non Reactive Non Reactive    Comment: Performed at [facility], [place] [address], [place], [place] [address]  Vitamin B12     Status: None   Collection Time: 03/25/20  7:16 AM  Result Value Ref Range   Vitamin B-12 511 180 - 914 pg/mL    Comment: (NOTE) This assay is not validated for testing neonatal or myeloproliferative syndrome specimens for Vitamin B12 levels. Performed at [facility], [place] [address] [place], [place] [address]   Sedimentation rate     Status: None   Collection Time: 03/25/20  7:16 AM  Result Value Ref Range   Sed Rate 6 0 - 16 mm/hr    Comment: Performed at [facility], [place] [address] [place], [place] [address]    Blood Alcohol level:  Lab Results   Component Value Date   ETH <10 78/67/6720    Metabolic Disorder Labs: Lab Results  Component Value Date   HGBA1C 5.2 03/24/2020   MPG 102.54 03/24/2020   No results found for: PROLACTIN Lab Results  Component Value Date   CHOL 119 03/24/2020   TRIG 29 03/24/2020   HDL 61 03/24/2020   CHOLHDL 2.0 03/24/2020   VLDL 6 03/24/2020   LDLCALC 52 03/24/2020    Physical Findings:  Musculoskeletal: Strength & Muscle Tone: within normal limits Gait & Station: normal, steady Patient leans: N/A  Psychiatric Specialty Exam: Physical Exam Vitals reviewed.  HENT:     Head: Normocephalic.  Pulmonary:     Effort: Pulmonary effort is normal.  Neurological:     Mental Status: He is alert.     Review of Systems  Respiratory: Negative for shortness of breath.   Cardiovascular: Negative for chest pain.  Gastrointestinal: Negative for nausea and vomiting.    Blood pressure (!) 116/58, pulse 99, temperature 98 F (36.7 C), temperature source Oral, resp. rate 18, height 6' (1.829 m), weight 79.4 kg, SpO2 95 %.Body mass index is 23.73 kg/m.  General Appearance: fair hygiene, appears stated age  Eye Contact:  Fair  Speech:  Clear and Coherent and Normal Rate  Volume:  Normal  Mood:  described as "fair" - appears aloof and mildly anxious  Affect:  mildly suspicious and anxious appearing  Thought  Process:  superficially goal directed  Orientation:  Full (Time, Place, and Person)  Thought Content:  Endorses some hypersexual thinking but denies AVH, ideas of reference, or first rank symptoms - does not appear to be grossly responding to internal/external stimuli on exam; no delusions noted   Suicidal Thoughts:  passive without intent or plan and contracts for safety  Homicidal Thoughts:  Denied  Memory:  Recent;   Fair  Judgement:  Fair  Insight:  Fair  Psychomotor Activity:  Normal  Concentration:  Concentration: Fair and Attention Span: Fair  Recall:  [employer] of Knowledge:   Fair  Language:  Good  Akathisia:  Negative  Assets:  Communication Skills Desire for Improvement Resilience  ADL's:  Intact  Cognition:  WNL  Sleep:  Number of Hours: 6.75   Treatment Plan Summary: ASSESSMENT: Diagnoses / Active Problems: Unspecified schizophrenia spectrum and other psychotic d/o (r/o schizophreniform d/o, r/o MDD with psychotic features; r/o bipolar d/o with psychotic features, r/o substance induced psychotic d/o, r/o psychotic d/o secondary to a general medical condition) 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 Safety and Monitoring:  -- Voluntary admission to inpatient psychiatric unit for safety, stabilization and treatment  -- Daily contact with patient to assess and evaluate symptoms and progress in treatment  -- Patient's case to be discussed in multi-disciplinary team meeting  -- Observation Level : q15 minute checks  -- Vital signs:  q12 hours  -- Precautions: suicide  2. Psychiatric Diagnoses and Treatment:   Unspecified schizophrenia spectrum and other psychotic d/o (r/o schizophreniform d/o, r/o MDD with psychotic features; r/o bipolar d/o with psychotic features, r/o substance induced psychotic d/o, r/o psychotic d/o secondary to a general medical condition) -- Continue Risperdal 1mg  po qhs (r/b/se/a to medication including risk of developing TD/EPS, weight gain, DM and high cholesterol were reviewed with patient and he verbalized understanding of discussed risks and consents to medication trial) -- Continue Prozac 10mg  qam (r/b/se/a of med including FDA black box warning for use of antidepressants in his age range were discussed with the patient and he consents to medication use)  -- Metabolic profile and EKG monitoring obtained while on an atypical antipsychotic (BMI: 23.73; Lipid Panel: cholesterol 119, HDL 61, LDL 52, triglycerides 29; HbgA1c:5.2 QTc:438)   -- New onset psychosis w/u to r/o an organic etiology to symptoms: (TSH 2.330, WBC  5.1, AST 31, ALT 33, UDS positive for amphetamines after overdose, B12 511, ESR 6, HIV nonreactive) Head CT noncontrast, RPR, CRP, ANA, heavy metal, and ceruloplasmin pending  -- Encouraged patient to participate in unit milieu and in scheduled group therapies   -- Short Term Goals: Ability to identify changes in lifestyle to reduce recurrence of condition will improve, Ability to demonstrate self-control will improve and Ability to identify and develop effective coping behaviors will improve  -- Long Term Goals: Improvement in symptoms so as ready for discharge   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 Discharge Planning:   -- Social work and case management to assist with discharge planning and identification of hospital follow-up needs prior to discharge  -- Estimated LOS: 5-7 days  -- Discharge Concerns: Need to establish a safety plan; Medication compliance and  effectiveness  -- Discharge Goals: Return home with outpatient referrals for mental health follow-up including medication management/psychotherapy  Harlow Asa, MD, FAPA 03/25/2020, 3:12 PM

## 2020-03-24 NOTE — Progress Notes (Signed)
Recreation Therapy Notes  Date:  2.18.22 Time: 0930 Location: [address] Group Room  Group Topic: Stress Management  Goal Area(s) Addresses:  Patient will identify positive stress management techniques. Patient will identify benefits of using stress management post d/c.  Intervention: Stress Management  Activity: Meditation.  LRT played a meditation on mindful walking. Patients were to listen and envision themselves walking along the beach enjoying sights, sounds and the feeling of what it would be like to feel the sun and water on their skin.  Patients were to listen and follow along as meditation played to engage in activity.    Education:  Stress Management, Discharge Planning.   Education Outcome: Acknowledges Education  Clinical Observations/Feedback: Pt did not attend group session.    Shahmeer Bunn, LRT/CTRS         Elieser Tetrick A 03/24/2020 11:30 AM 

## 2020-03-24 NOTE — [facility] Counselor (Signed)
Pt approached the nurses station stating he was currently having "an impulse" and wanted to speak to someone. CSW followed pt to his room where he shared that his mother made him feel bad about himself and talked about how when he feels this way about himself it makes him want to hurt himself. Pt stated that he wanted to stabbed himself. CSW directed pt to take deep breaths and to focus on his breathing. CSW explained to pt that at this time she felt that it was best for his safety for staff to remove all belongings from his rooms. Patient agreed. CSW asked pt if he would like to see if the nurse to see if she has some medication to help him calm down and he stated he did. CSW walked with pt to the medication window and explained the situation to nurse [place]. CSW invited pt to stay out of his room and hang out in the day room. Pt had previously noted to CSW that he enjoys puzzles so CSW gave pt puzzles to do in the day room. CSW and pt discussed how the conversation on the phone with his mother may have triggered his impulse. CSW recommended that pt stay off the phone for the next 48 hours in order to take time to focus on himself. Pt agreed. CSW escorted pt to the dayroom. CSW informed Dr. Mason Jim of the encounter.   Fredirick Lathe, LCSWA Clinicial Social Worker [employer]

## 2020-03-24 NOTE — Progress Notes (Signed)
   03/23/20 2300  Psych Admission Type (Psych Patients Only)  Admission Status Voluntary  Psychosocial Assessment  Patient Complaints Depression;Other (Comment) (impulsivity)  Eye Contact Brief  Facial Expression Blank  Affect Anxious  Speech Logical/coherent  Interaction Assertive  Motor Activity Slow  Appearance/Hygiene Unremarkable;In scrubs  Behavior Characteristics Cooperative  Mood Depressed  Thought Process  Coherency Concrete thinking  Content Blaming self  Delusions None reported or observed  Perception WDL  Hallucination None reported or observed (previous AH earlier this week - chatter)  Judgment Poor  Confusion None  Danger to Self  Current suicidal ideation? Passive  Self-Injurious Behavior Some self-injurious ideation observed or expressed.  No lethal plan expressed   Agreement Not to Harm Self Yes  Description of Agreement verbal agreement to approach staff  Danger to Others  Danger to Others None reported or observed
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
## 2020-03-24 NOTE — BHH Counselor (Signed)
CSW provided pt with worksheets about various grounding skills. CSW provided pt with reading material about movie production jobs as this is something he is interested in. CSW also gave pt a worksheet for goal setting and building self-esteem. Pt thanked CSW for these materials.  Jeffrey [name], LCSWA Clinicial Social Worker [employer]

## 2020-03-24 NOTE — Tx Team (Addendum)
Interdisciplinary Treatment and Diagnostic Plan Update  03/24/2020 Time of Session: 9:05am  Jeffrey [name] MRN: [id number]  Principal Diagnosis: <principal problem not specified>  Secondary Diagnoses: Active Problems:   Severe recurrent major depression without psychotic features ([place])   Current Medications:  Current Facility-Administered Medications  Medication Dose Route Frequency Provider Last Rate Last Admin  . FLUoxetine (PROZAC) capsule 10 mg  10 mg Oral Daily Lindon Romp A, NP   10 mg at 03/24/20 6720  . hydrOXYzine (ATARAX/VISTARIL) tablet 25 mg  25 mg Oral TID PRN Lindon Romp A, NP   25 mg at 03/24/20 1039  . multivitamin with minerals tablet 1 tablet  1 tablet Oral Daily Lindon Romp A, NP   1 tablet at 03/24/20 9470   PTA Medications: Medications Prior to Admission  Medication Sig Dispense Refill Last Dose  . FLUoxetine (PROZAC) 10 MG capsule Take 1 capsule (10 mg total) by mouth daily. 30 capsule 0   . hydrOXYzine (ATARAX/VISTARIL) 25 MG tablet Take 1 tablet (25 mg total) by mouth 3 (three) times daily as needed for anxiety. 30 tablet 0   . Multiple Vitamin (MULTIVITAMIN ADULT PO) Take 1 tablet by mouth daily.     Marland Kitchen NIACIN PO Take 1 tablet by mouth daily.       Patient Stressors: Network engineer difficulties Medication change or noncompliance Occupational concerns  Patient Strengths: Average or above average intelligence Capable of independent living Motivation for treatment/growth Supportive family/friends  Treatment Modalities: Medication Management, Group therapy, Case management,  1 to 1 session with clinician, Psychoeducation, Recreational therapy.   Physician Treatment Plan for Primary Diagnosis: <principal problem not specified> Long Term Goal(s):     Short Term Goals:    Medication Management: Evaluate patient's response, side effects, and tolerance of medication regimen.  Therapeutic Interventions: 1 to 1 sessions, Unit Group  sessions and Medication administration.  Evaluation of Outcomes: Not Met  Physician Treatment Plan for Secondary Diagnosis: Active Problems:   Severe recurrent major depression without psychotic features ([place])  Long Term Goal(s):     Short Term Goals:       Medication Management: Evaluate patient's response, side effects, and tolerance of medication regimen.  Therapeutic Interventions: 1 to 1 sessions, Unit Group sessions and Medication administration.  Evaluation of Outcomes: Not Met   RN Treatment Plan for Primary Diagnosis: <principal problem not specified> Long Term Goal(s): Knowledge of disease and therapeutic regimen to maintain health will improve  Short Term Goals: Ability to remain free from injury will improve, Ability to participate in decision making will improve, Ability to verbalize feelings will improve, Ability to disclose and discuss suicidal ideas and Ability to identify and develop effective coping behaviors will improve  Medication Management: RN will administer medications as ordered by provider, will assess and evaluate patient's response and provide education to patient for prescribed medication. RN will report any adverse and/or side effects to prescribing provider.  Therapeutic Interventions: 1 on 1 counseling sessions, Psychoeducation, Medication administration, Evaluate responses to treatment, Monitor vital signs and CBGs as ordered, Perform/monitor CIWA, COWS, AIMS and Fall Risk screenings as ordered, Perform wound care treatments as ordered.  Evaluation of Outcomes: Not Met   LCSW Treatment Plan for Primary Diagnosis: <principal problem not specified> Long Term Goal(s): Safe transition to appropriate next level of care at discharge, Engage patient in therapeutic group addressing interpersonal concerns.  Short Term Goals: Engage patient in aftercare planning with referrals and resources, Increase social support, Increase emotional regulation, Facilitate  acceptance of  mental health diagnosis and concerns, Identify triggers associated with mental health/substance abuse issues and Increase skills for wellness and recovery  Therapeutic Interventions: Assess for all discharge needs, 1 to 1 time with Social worker, Explore available resources and support systems, Assess for adequacy in community support network, Educate family and significant other(s) on suicide prevention, Complete Psychosocial Assessment, Interpersonal group therapy.  Evaluation of Outcomes: Not Met   Progress in Treatment: Attending groups: No. Participating in groups: No. Taking medication as prescribed: Yes. Toleration medication: Yes. Family/Significant other contact made: Yes, individual(s) contacted:  Mother  Patient understands diagnosis: Yes. and No. Discussing patient identified problems/goals with staff: Yes. Medical problems stabilized or resolved: Yes. Denies suicidal/homicidal ideation: Yes. Issues/concerns per patient self-inventory: No.   New problem(s) identified: No, Describe:  None   New Short Term/Long Term Goal(s): medication stabilization, elimination of SI thoughts, development of comprehensive mental wellness plan.   Patient Goals:  "To go to group"   Discharge Plan or Barriers: Patient recently admitted. CSW will continue to follow and assess for appropriate referrals and possible discharge planning.   Reason for Continuation of Hospitalization: Depression Medication stabilization Suicidal ideation  Estimated Length of Stay: 3 to 5 days   Attendees: Patient: Jeffrey [name]  03/24/2020   Physician: Lala Lund, MD  03/24/2020   Nursing:  03/24/2020   RN Care Manager: 03/24/2020   Social Worker: Verdis Frederickson, [place]  03/24/2020   Recreational Therapist:  03/24/2020   Other:  03/24/2020   Other:  03/24/2020   Other: 03/24/2020     Scribe for Treatment Team: Darleen Crocker, [place] 03/24/2020 10:51 AM

## 2020-03-25 LAB — VITAMIN B12: Vitamin B-12: 511 pg/mL (ref 180–914)

## 2020-03-25 LAB — SEDIMENTATION RATE: Sed Rate: 6 mm/hr (ref 0–16)

## 2020-03-25 LAB — HIV ANTIBODY (ROUTINE TESTING W REFLEX): HIV Screen 4th Generation wRfx: NONREACTIVE

## 2020-03-25 NOTE — B[facility] Group Notes (Signed)
B[facility] Group Notes: (Clinical Social Work)   03/25/2020      Type of Therapy:  Group Therapy   Participation Level:  Did Not Attend - was invited both individually by MHT and by overhead announcement, chose not to attend.   Ambrose Mantle, LCSW 03/25/2020, 3:09 PM

## 2020-03-25 NOTE — Progress Notes (Signed)
Patient presents with a flat affect, avoidant eye contact and is guarded. During the assessment, he appeared suspicious and would forward little information using one to two word responses. He denies SI/HI. He denies AVH.   Orders reviewed. Vital signs reviewed. Verbal support provided. 15 minute checks performed for safety.   Patient compliant with taking medications and denies any medication side effects.

## 2020-03-25 NOTE — B[facility] Group Notes (Signed)
Psychoeducational Group Note  Date: 03-25-2020 Time: 0900-1000    Goal Setting   Purpose of Group: This group helps to provide patients with the steps of setting [name] goal that is specific, measurable, attainable, realistic and time specific. [name] discussion on how we keep ourselves stuck with negative self talk.    Participation Level:  Did not attend   Jeffrey [name]  

## 2020-03-25 NOTE — Progress Notes (Signed)
B[facility] Group Notes:  (Nursing/MHT/Case Management/Adjunct)  Date:  03/25/2020  Time:  2015  Type of Therapy:  wrap up group  Participation Level:  Active  Participation Quality:  Appropriate, Attentive, Sharing and Supportive  Affect:  Appropriate  Cognitive:  Alert  Insight:  Improving  Engagement in Group:  Engaged  Modes of Intervention:  Clarification, Education and Support  Summary of Progress/Problems: Positive thinking and positive change were discussed.   Marcille Buffy 03/25/2020, 8:29 PM

## 2020-03-25 NOTE — Progress Notes (Signed)
   03/25/20 0042  Psych Admission Type (Psych Patients Only)  Admission Status Voluntary  Psychosocial Assessment  Patient Complaints Depression  Eye Contact Brief  Facial Expression Blank  Affect Anxious  Speech Logical/coherent  Interaction Assertive  Motor Activity Slow  Appearance/Hygiene Unremarkable;In scrubs  Behavior Characteristics Cooperative  Mood Depressed;Anxious  Thought Process  Coherency Concrete thinking  Content Blaming others  Delusions None reported or observed  Perception WDL  Hallucination None reported or observed (previous AH earlier this week - chatter)  Judgment Poor  Confusion None  Danger to Self  Current suicidal ideation? Denies  Self-Injurious Behavior No self-injurious ideation or behavior indicators observed or expressed   Agreement Not to Harm Self Yes  Description of Agreement verbal agreement to approach staff  Danger to Others  Danger to Others None reported or observed  D: Patient observed on the unit. Mood and affect appeared depressed.  A: Medications administered as prescribed. Support and encouragement provided as needed.  R: Patient remains safe on the unit. Will continue to monitor for safety and stability.

## 2020-03-25 NOTE — B[facility] Group Notes (Signed)
Psychoeducational Group Note    Date:03/25/2020 Time: 1300-1400    Life Skills:  A group where two lists are made. What people need and what are things that we do that are healthy. The lists are developed by the patients and it is explained that we often do the actions that are not healthy to get our list of needs met.   Purpose of Group: . The group focus' on teaching patients on how to identify their needs and how to develop the coping skills needed to get their needs met  Participation Level:  Active  Participation Quality:  Appropriate  Affect:  Appropriate  Cognitive:  Oriented  Insight:  Improving  Engagement in Group:  Engaged  Additional Comments:   Pt was attentive  Throughout the group.  Paulino Rily

## 2020-03-26 ENCOUNTER — Ambulatory Visit (HOSPITAL_COMMUNITY): Admit: 2020-03-26 | Payer: No Typology Code available for payment source

## 2020-03-26 DIAGNOSIS — F1290 Cannabis use, unspecified, uncomplicated: Secondary | ICD-10-CM

## 2020-03-26 LAB — CERULOPLASMIN: Ceruloplasmin: 20.9 mg/dL (ref 16.0–31.0)

## 2020-03-26 LAB — RPR: RPR Ser Ql: NONREACTIVE

## 2020-03-26 MED ORDER — MELATONIN 3 MG PO TABS
3.0000 mg | ORAL_TABLET | Freq: Every evening | ORAL | Status: DC | PRN
  Administered 2020-03-26 – 2020-03-27 (×2): 3 mg via ORAL
  Filled 2020-03-26 (×2): qty 1

## 2020-03-26 NOTE — Progress Notes (Signed)
Amy in CT at [facility] called and said that the ED is full and the pt may be sitting in the ED for a long period of time for CT. Patient is now scheduled for CT scan on 03/27/20 at 11 am.

## 2020-03-26 NOTE — Progress Notes (Signed)

## 2020-03-26 NOTE — BHH Group Notes (Signed)
Adult Psychoeducational Group Not Date:  03/26/2020 Time:  0900-1045 Group Topic/Focus: PROGRESSIVE RELAXATION. A group where deep breathing is taught and tensing and relaxation muscle groups is used. Imagery is used as well.  Pts are asked to imagine 3 pillars that hold them up when they are not able to hold themselves up.  Participation Level:  Did not attend   Jeffrey [name] A   

## 2020-03-26 NOTE — Progress Notes (Signed)
   03/26/20 2155  Psych Admission Type (Psych Patients Only)  Admission Status Voluntary  Psychosocial Assessment  Patient Complaints None  Eye Contact Brief  Facial Expression Flat  Affect Anxious  Speech Logical/coherent  Interaction Guarded  Motor Activity Other (Comment) (WDL)  Appearance/Hygiene Unremarkable  Behavior Characteristics Guarded  Mood Anxious;Preoccupied  Thought Process  Coherency Concrete thinking  Content Preoccupation  Delusions None reported or observed  Perception WDL  Hallucination Auditory  Judgment Poor  Confusion None  Danger to Self  Current suicidal ideation? Denies  Self-Injurious Behavior No self-injurious ideation or behavior indicators observed or expressed   Agreement Not to Harm Self Yes  Description of Agreement verbal agreement to approach staff  Danger to Others  Danger to Others None reported or observed

## 2020-03-26 NOTE — Progress Notes (Signed)
Patient rates depression 0, and anxiety 0 (10 being worst). He denies SI/HI. He denies AVH. He reports good sleep last night. He reports having a fair appetite. He has been visible on the unit and attends scheduled groups. He verbalized readiness for discharge home and stated that he would like to go home as soon as possible.   Orders reviewed. Vital signs reviewed. Verbal support provided. 15 minute checks performed for safety. Patient scheduled for head CT scan.   Patient compliant with treatment plan.

## 2020-03-26 NOTE — Progress Notes (Addendum)
[facility] MD Progress Note  Jeffrey [name]  MRN:  [id number]  03/26/20  Chief Complaint: suicide attempt  Subjective:  Jeffrey [name] is a 20 y.o. male with no known past psychiatric history, who was initially admitted for inpatient psychiatric hospitalization on 03/23/2020 after a suicide attempt via ingestion of Niacin and Adderall. On admission, he endorsed worsening depressive symptoms over the last 2 months and reported new onset of AVH, paranoia, and ideas of reference in the year preceding admission. He admitted to use of [facility] from November 2021 into January 2022. The patient is currently on Hospital Day 3.   Chart Review from last 24 hours:  The patient's chart was reviewed and nursing notes were reviewed. The patient's case was discussed in multidisciplinary team meeting. Per nursing notes, he attended 2 of 4 groups yesterday. He has remained guarded and avoidant with eye contact with staff but has denied AVH. No response to internal/external stimuli or delusions noted. He has denied SI. Per [facility] he has been compliant with scheduled medications. He received Vistaril X1 for anxiety and Trazodone X1 for sleep.  Information Obtained Today During Patient Interview: The patient was seen and evaluated in his room. He states that he slept well overnight but had a vivid nightmare which he attributes to Trazodone use. He states he still has "bad imagery" in his head but denies true VH. He states he has been having negative self-talk in his own voice and denies command AH or internal/external AH. He states he no longer believes his thoughts are so loud they can be heard, and he denies thought insertion/withdrawal or ideas of reference on the unit. He states he does not feel paranoid on the unit.He denies hypersexual urges today and denies manic or hypomanic symptoms today. He states his mood is "a little irritable" but he denies feeling overly anxious or depressed today. He is tolerating the Risperdal and Prozac  without noted side-effects. He voices no physical complaints. He reports good appetite.   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 The direct patient care time included face-to-face time with the patient, reviewing the patient's chart, communicating with other professionals, and coordinating care. Greater than 50% of this time was spent in counseling or coordinating care with the patient regarding goals of hospitalization, psycho-education, and discharge planning needs.  Past Psychiatric History:  See admission H&P  Past Medical History: Denied   Past Surgical History: Denied  Family History: See admission H&P  Family Psychiatric  History: mother with possible bipolar sx and ADD; brother with ADD  Social History:  Social History   Substance and Sexual Activity  Alcohol Use Yes   Comment: once a week 1-2 glasses     Social History   Substance and Sexual Activity  Drug Use Not Currently  . Types: Marijuana   Comment: last use marijuana - over a month ago    Social History   Socioeconomic History  . Marital status: Single    Spouse name: Not on file  . Number of children: Not on file  . Years of education: Not on file  . Highest education level: Not on file  Occupational  History  . Not on file  Tobacco Use  . Smoking status: Never Smoker  . Smokeless tobacco: Never Used  Substance and Sexual Activity  . Alcohol use: Yes    Comment: once a week 1-2 glasses  . Drug use: Not Currently    Types: Marijuana    Comment: last use marijuana - over a month ago  . Sexual activity:  Not Currently  Other Topics Concern  . Not on file  Social History Narrative  . Not on file   Social Determinants of Health   Financial Resource Strain: Not on file  Food Insecurity: Not on file  Transportation Needs: Not on file  Physical Activity: Not on file  Stress: Not on file  Social Connections: Not on file   Sleep: Fair secondary to nightmares  Appetite:  Good  Current Medications: Current Facility-Administered Medications  Medication Dose Route Frequency Provider Last Rate Last Admin  . acetaminophen (TYLENOL) tablet 650 mg  650 mg Oral Q6H PRN Nwoko, Agnes I, NP      . alum & mag hydroxide-simeth (MAALOX/MYLANTA) 200-200-20 MG/5ML suspension 30 mL  30 mL Oral Q4H PRN Nwoko, Agnes I, NP      . FLUoxetine (PROZAC) capsule 10 mg  10 mg Oral Daily Lindon Romp A, NP   10 mg at 03/26/20 4782  . hydrOXYzine (ATARAX/VISTARIL) tablet 25 mg  25 mg Oral TID PRN Rozetta Nunnery, NP   25 mg at 03/25/20 2205  . magnesium hydroxide (MILK OF MAGNESIA) suspension 30 mL  30 mL Oral Daily PRN Lindell Spar I, NP      . multivitamin with minerals tablet 1 tablet  1 tablet Oral Daily Lindon Romp A, NP   1 tablet at 03/26/20 0735  . risperiDONE (RISPERDAL) tablet 1 mg  1 mg Oral QHS Nwoko, Agnes I, NP   1 mg at 03/25/20 2205  . traZODone (DESYREL) tablet 50 mg  50 mg Oral QHS PRN Lindell Spar I, NP   50 mg at 03/25/20 2205    Lab Results:  Results for orders placed or performed during the hospital encounter of 03/23/20 (from the past 48 hour(s))  RPR     Status: None   Collection Time: 03/25/20  7:16 AM  Result Value Ref Range   RPR Ser Ql NON REACTIVE NON REACTIVE    Comment: Performed at [facility], [address], [place], [place] [address]  HIV Antibody (routine testing w rflx)     Status: None   Collection Time: 03/25/20  7:16 AM  Result Value Ref Range   HIV Screen 4th Generation wRfx Non Reactive Non Reactive    Comment: Performed at [facility], [place] [address], [place], [place] [address]  Vitamin B12     Status: None   Collection Time: 03/25/20  7:16 AM  Result Value Ref Range   Vitamin B-12 511 180 - 914 pg/mL    Comment: (NOTE) This assay is not validated for testing neonatal or myeloproliferative syndrome specimens for Vitamin B12 levels. Performed at [facility], [place] [address], [place], [place] [address]   Sedimentation rate     Status: None   Collection Time: 03/25/20  7:16 AM  Result Value Ref Range   Sed Rate 6 0 - 16 mm/hr    Comment: Performed at [facility], [place] [address], [place], [place] [address]    Blood Alcohol level:  Lab Results  Component Value Date  ETH <10 39/04/90    Metabolic Disorder Labs: Lab Results  Component Value Date   HGBA1C 5.2 03/24/2020   MPG 102.54 03/24/2020   No results found for: PROLACTIN Lab Results  Component Value Date   CHOL 119 03/24/2020   TRIG 29 03/24/2020   HDL 61 03/24/2020   CHOLHDL 2.0 03/24/2020   VLDL 6 03/24/2020   LDLCALC 52 03/24/2020    Physical Findings:  Musculoskeletal: Strength & Muscle Tone: within normal limits Gait & Station: normal, steady Patient leans: N/A  Psychiatric Specialty Exam: Physical Exam Vitals reviewed.  HENT:     Head: Normocephalic.  Pulmonary:     Effort: Pulmonary effort is normal.  Neurological:     Mental Status: He is alert.     Review of Systems  Respiratory: Negative for shortness of breath.   Cardiovascular: Negative for chest pain.  Gastrointestinal: Negative for nausea and vomiting.    Blood pressure 101/63, pulse 90, temperature 98.2 F (36.8 C), resp. rate 18, height 6' (1.829 m), weight 79.4 kg, SpO2 100 %.Body mass index is 23.73 kg/m.  General Appearance: improved hygiene, appears stated age, casually dressed in scrubs  Eye Contact:  Improved  Speech:  Clear and Coherent and Normal Rate  Volume:  Normal  Mood:  Described as mildly irritable   Affect:  Less guarded,  still mildly anxious appearing  Thought Process:  superficially goal directed but circumstantial  Orientation:  Full (Time, Place, and Person)  Thought Content:  Endorses negative self talk and "bad imagery" in his head but denies true AVH; denies ideas of reference or first rank symptoms; appears less paranoid and less guarded on exam; does not appear to be grossly responding to internal/external stimuli on exam; no delusions noted   Suicidal Thoughts: Denied  Homicidal Thoughts:  Denied  Memory:  Recent;   Fair  Judgement:  Fair  Insight:  Fair  Psychomotor Activity:  Normal, no tremor  Concentration:  Concentration: Fair and Attention Span: Fair  Recall:  [employer] of Knowledge:  Fair  Language:  Good  Akathisia:  Negative  Assets:  Communication Skills Desire for Improvement Resilience  ADL's:  Intact  Cognition:  WNL  Sleep:  Number of Hours: 5.5   Treatment Plan Summary: ASSESSMENT: Diagnoses / Active Problems: Unspecified schizophrenia spectrum and other psychotic d/o (r/o schizophreniform d/o, r/o MDD with psychotic features; r/o bipolar d/o with psychotic features, r/o substance induced psychotic d/o, r/o psychotic d/o secondary to a general medical condition) 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 Safety and Monitoring:  -- Voluntary admission to inpatient psychiatric unit for safety, stabilization and treatment  -- Daily contact with patient to assess and evaluate symptoms and progress in treatment  -- Patient's case to be discussed in multi-disciplinary team meeting  -- Observation Level : q15 minute checks  -- Vital signs:  q12 hours  -- Precautions: suicide  2. Psychiatric Diagnoses and Treatment:   Unspecified schizophrenia spectrum and other psychotic d/o (r/o schizophreniform d/o, r/o MDD with psychotic features; r/o bipolar d/o with psychotic features, r/o substance induced psychotic d/o, r/o psychotic d/o secondary to a general medical  condition) -- Continue Risperdal 68m po qhs   -- Continue Prozac 168mqam titrating up during admission as tolerated  -- Discontinue Trazodone and try Melatonin 19m96mo qhs PRN insomnia due to c/o nightmares with Trazodone  -- Metabolic profile and EKG monitoring obtained while on an atypical antipsychotic (BMI: 23.73; Lipid Panel: cholesterol 119,  HDL 61, LDL 52, triglycerides 29; HbgA1c:5.2 QTc:438)   -- New onset psychosis w/u to r/o an organic etiology to symptoms: (TSH 2.330, WBC 5.1, AST 31, ALT 33, UDS positive for amphetamines after overdose, B12 511, ESR 6, HIV nonreactive, RPR nonreactive) Head CT noncontrast, CRP, ANA, heavy metal, and ceruloplasmin pending  -- Encouraged patient to participate in unit milieu and in scheduled group therapies   -- Attempting to get collateral from family with his signed consent  -- Short Term Goals: Ability to identify changes in lifestyle to reduce recurrence of condition will improve, Ability to demonstrate self-control will improve and Ability to identify and develop effective coping behaviors will improve  -- Long Term Goals: Improvement in symptoms so as ready for discharge   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 Discharge Planning:   -- Social work and case management to assist with discharge planning and identification of hospital follow-up needs prior to discharge  -- Estimated LOS: 3-4 days  -- Discharge Concerns: Need to establish a safety plan; Medication compliance and effectiveness  -- Discharge Goals: Return home with outpatient referrals for mental health follow-up including medication management/psychotherapy  Harlow Asa, MD, FAPA 03/26/2020, 11:51 AM

## 2020-03-26 NOTE — Progress Notes (Signed)
   03/26/20 0010  COVID-19 Daily Checkoff  Have you had a fever (temp > 37.80C/100F)  in the past 24 hours?  No  If you have had runny nose, nasal congestion, sneezing in the past 24 hours, has it worsened? No  COVID-19 EXPOSURE  Have you traveled outside the state in the past 14 days? No  Have you been in contact with someone with a confirmed diagnosis of COVID-19 or PUI in the past 14 days without wearing appropriate PPE? No  Have you been living in the same home as a person with confirmed diagnosis of COVID-19 or a PUI (household contact)? No  Have you been diagnosed with COVID-19? No
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
## 2020-03-26 NOTE — BHH Group Notes (Signed)
Psychoeducational Group Note  Date: 03-26-20 Time:  1300  Group Topic/Focus:  Making Healthy Choices:   The focus of this group is to help patients identify negative/unhealthy choices they were using prior to admission and identify positive/healthier coping strategies to replace them upon discharge.In this group, patients started asking about the brain and how the brain works with and how the chemicals work for those who use substances, the pros and cons of saboxone.  Participation Level:  Active  Participation Quality:  Appropriate  Affect:  Appropriate  Cognitive:  Oriented  Insight:  Improving  Engagement in Group:  Engaged  Additional Comments:. Rates energy at a 10/10. Did not speak during the group, but was very attentive.  Dione Housekeeper

## 2020-03-26 NOTE — [facility] Group Notes (Signed)
[facility] LCSW Group Therapy Note  03/26/2020    Type of Therapy and Topic:  Group Therapy:  Adding Supports Including Yourself  Participation Level:  Active   Description of Group:   Patients in this group were introduced to the concept that additional supports including self-support are an essential part of recovery.  Patients listed their current healthy and unhealthy supports, and discussed what is the difference between the two.   A song entitled "My Own Hero" was played and a group discussion ensued in which patients stated they could relate to the song and it inspired them to realize they have be willing to help themselves in order to succeed, because other people cannot achieve sobriety or stability for them.  Additional songs were played ("Fight For It" then "I Am Enough") to encourage patients toward self-advocacy and self-support as part of their recovery.  They discussed their reactions to these songs' messages, which were positive and hopeful.  Before group ended, they identified the supports they believe they need to add to their lives to achieve their goals at discharge.   Therapeutic Goals: 1)  explain the difference between healthy and unhealthy supports and discuss what specific supports are currently in patients' lives 2)  demonstrate the importance of being a key part of one's own support system 3)  discuss the need for appropriate boundaries with supports 4)  elicit ideas from patients about supports that need to be added in order to achieve goals   Summary of Patient Progress:   The patient listed current healthy supports as his mother, brother, and dog and current unhealthy supports as some family members especially cousin who are supportive by providing him with a job but also provide him with marijuana.  He showed insight in the questions he asked, and was receptive to all suggestions.    Therapeutic Modalities:   Motivational Interviewing Activity  Lynnell Chad

## 2020-03-26 NOTE — Progress Notes (Signed)
   03/26/20 0013  Psych Admission Type (Psych Patients Only)  Admission Status Voluntary  Psychosocial Assessment  Patient Complaints None  Eye Contact Brief  Facial Expression Flat  Affect Appropriate to circumstance  Speech Logical/coherent  Interaction Forwards little  Motor Activity Other (Comment) (WDL)  Appearance/Hygiene Unremarkable  Behavior Characteristics Appropriate to situation  Mood Anxious  Thought Process  Coherency Concrete thinking  Content Preoccupation  Delusions None reported or observed  Perception WDL  Hallucination None reported or observed  Judgment Poor  Confusion None  Danger to Self  Current suicidal ideation? Denies  Self-Injurious Behavior No self-injurious ideation or behavior indicators observed or expressed   Agreement Not to Harm Self Yes  Description of Agreement verbal agreement to approach staff  Danger to Others  Danger to Others None reported or observed

## 2020-03-27 ENCOUNTER — Other Ambulatory Visit: Payer: Self-pay

## 2020-03-27 ENCOUNTER — Ambulatory Visit (HOSPITAL_COMMUNITY)
Admission: RE | Admit: 2020-03-27 | Discharge: 2020-03-27 | Disposition: A | Discharge status: Home / Self Care | Payer: No Typology Code available for payment source | Source: Ambulatory Visit | Attending: Emergency Medicine | Admitting: Emergency Medicine

## 2020-03-27 DIAGNOSIS — R519 Headache, unspecified: Secondary | ICD-10-CM | POA: Diagnosis not present

## 2020-03-27 LAB — HEAVY METALS, BLOOD
Arsenic: 2 ug/L (ref 0–9)
Lead: <1 ug/dL (ref 0–4)
Mercury: 3.1 ug/L (ref 0.0–14.9)

## 2020-03-27 LAB — ANA: Anti Nuclear Antibody (ANA): NEGATIVE

## 2020-03-27 MED ORDER — DOCUSATE SODIUM 100 MG PO CAPS
100.0000 mg | ORAL_CAPSULE | Freq: Every day | ORAL | Status: DC
  Administered 2020-03-27: 100 mg via ORAL
  Filled 2020-03-27 (×4): qty 1

## 2020-03-27 NOTE — Plan of Care (Signed)
Nurse discussed anxiety, depression and coping skills with patient.  

## 2020-03-27 NOTE — Progress Notes (Signed)
D:  Patient's self inventory sheet, patient sleeps good, sleep medication helpful.  Good appetite, normal energy level, good concentration.  Denied depression, hopeless and anxiety.  Denied withdrawals.  Denied SI.  Denied physical problems.  Denied physical pain.  Goal is reading.  Plans to read.  Does have discharge plans. A:  Medications administered per MD orders.  Emotional support and encouragement given patient. R:  Safety maintained with 15 minute checks.  Denied SI and HI, contracts for safety.  Denied A/V hallucinations.

## 2020-03-27 NOTE — Progress Notes (Signed)
[facility] MD Progress Note  Jamahl Lemmons  MRN:  [id number]  03/27/20  Chief Complaint: suicide attempt  Subjective:  Jeffrey [name] is a 20 y.o. male with no known past psychiatric history, who was initially admitted for inpatient psychiatric hospitalization on 03/23/2020 after a suicide attempt via ingestion of Niacin and Adderall. On admission, he endorsed worsening depressive symptoms over the last 2 months and reported new onset of AVH, paranoia, and ideas of reference in the year preceding admission. He admitted to use of [facility] from November 2021 into January 2022. The patient is currently on Hospital Day 4.   Chart Review from last 24 hours:  The patient's chart was reviewed and nursing notes were reviewed. The patient's case was discussed in multidisciplinary team meeting. Per nursing notes, patient was guarded in his interactions but no behavioral issues or agitation noted. He has denied SI, HI or AVH and attended groups. Per MAR, he has been compliant with scheduled medications. He received Vistaril X1 for anxiety and Melatonin X1 for sleep.  Information Obtained Today During Patient Interview: The patient was seen and evaluated in his room. He states he is feeling "better" today and describes his mood as "back to normal." He states "I'm just chilling" when asked about his activities over the weekend. He denies AH, negative, internal self-talk, VH, or ideas of reference. He denies thought insertion/withdrawal/broadcasting and denies paranoia on the unit. He denies SI or HI and states he is no longer having nightmares or intrusive, graphic suicide images pop in his head. He reports stable sleep and appetite. I advised that we are working to get his Head CT today and are awaiting his ANA but the remainder of his organic medical w/u for new onset psychosis is normal. I discussed the report from social work with collateral from his mother, and he agrees that his symptoms seem to correlate with when he used  Delta 8. He again denies any THC or Delta 8 use since January. We dicussed that the hope is that this is a substance induced psychotic d/o that will continue to resolve with time and medications, but I cannot say for certain that his illicit drug use did not unmask an underlying primary psychotic or mood d/o. He was urged to remain compliant with medications and to abstain from drug and alcohol use after discharge. When questioned about discharge planning, he states he intends to stay with mom after he leaves. I advised that per social work notes, mom may not let him return and I advised that he work with social work on discharge planning needs/housing options.  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 The direct patient care time included face-to-face time with the patient, reviewing the patient's chart, communicating with other professionals, and coordinating care. Greater than 50% of this time was spent in counseling or coordinating care with the patient regarding goals of hospitalization, psycho-education, and discharge planning needs.  Past Psychiatric History:  See admission H&P  Past Medical History: Denied   Past Surgical History: Denied  Family History: See admission H&P  Family Psychiatric  History: mother with possible bipolar sx and ADD; brother with ADD  Social History:  Social History   Substance and Sexual Activity  Alcohol Use Yes   Comment: once a week 1-2 glasses     Social History   Substance and Sexual Activity  Drug Use Not Currently  . Types: Marijuana   Comment: last use marijuana - over a  month ago    Social History   Socioeconomic History  . Marital status: Single    Spouse name: Not on file  . Number of children: Not on file  . Years of education: Not on file  . Highest education level: Not on file  Occupational History  . Not on file  Tobacco Use  . Smoking status: Never Smoker  . Smokeless tobacco: Never Used  Substance and Sexual Activity  . Alcohol use: Yes    Comment: once a week 1-2 glasses  . Drug use: Not Currently    Types: Marijuana    Comment: last use marijuana - over a month ago  . Sexual activity: Not Currently  Other Topics Concern  . Not on file  Social History Narrative  . Not on file   Social Determinants of Health   Financial Resource Strain: Not on file  Food Insecurity: Not on file  Transportation Needs: Not on file  Physical Activity: Not on file  Stress: Not on file  Social Connections: Not on file   Sleep: Improved  Appetite:  Good  Current Medications: Current Facility-Administered Medications  Medication Dose Route Frequency Provider Last Rate Last Admin  . acetaminophen (TYLENOL) tablet 650 mg  650 mg Oral Q6H PRN Nwoko, Agnes I, NP      . alum & mag hydroxide-simeth (MAALOX/MYLANTA) 200-200-20 MG/5ML suspension 30 mL  30 mL Oral Q4H PRN Nwoko, Agnes I, NP      . FLUoxetine (PROZAC) capsule 10 mg  10 mg Oral Daily Lindon Romp A, NP   10 mg at 03/26/20 4920  . hydrOXYzine (ATARAX/VISTARIL) tablet 25 mg  25 mg Oral TID PRN Rozetta Nunnery, NP   25 mg at 03/26/20 2119  . magnesium hydroxide (MILK OF MAGNESIA) suspension 30 mL  30 mL Oral Daily PRN Nwoko, Agnes I, NP      . melatonin tablet 3 mg  3 mg Oral QHS PRN Harlow Asa, MD   3 mg at 03/26/20 2119  . multivitamin with minerals tablet 1 tablet  1 tablet Oral Daily Lindon Romp A, NP   1 tablet at 03/26/20 0735  . risperiDONE (RISPERDAL) tablet 1 mg  1 mg Oral QHS Nwoko, Agnes I, NP   1 mg at 03/26/20 2119    Lab Results:  No results found for this or any previous  visit (from the past 48 hour(s)).  Blood Alcohol level:  Lab Results  Component Value Date   ETH <10 11/10/1217    Metabolic Disorder Labs: Lab Results  Component Value Date   HGBA1C 5.2 03/24/2020   MPG 102.54 03/24/2020   No results found for: PROLACTIN Lab Results  Component Value Date   CHOL 119 03/24/2020   TRIG 29 03/24/2020   HDL 61 03/24/2020   CHOLHDL 2.0 03/24/2020   VLDL 6 03/24/2020   LDLCALC 52 03/24/2020    Physical Findings:  Musculoskeletal: Strength & Muscle Tone: within normal limits Gait & Station: normal, steady Patient leans: N/A  Psychiatric Specialty Exam: Physical Exam Vitals reviewed.  HENT:     Head: Normocephalic.  Pulmonary:     Effort: Pulmonary effort is normal.  Neurological:  Mental Status: He is alert.     Review of Systems  Respiratory: Negative for shortness of breath.   Cardiovascular: Negative for chest pain.  Gastrointestinal: Negative for nausea and vomiting.    Blood pressure 101/63, pulse 90, temperature 98.2 F (36.8 C), resp. rate 18, height 6' (1.829 m), weight 79.4 kg, SpO2 100 %.Body mass index is 23.73 kg/m.  General Appearance: good hygiene, appears stated age, casually dressed in scrubs and jean jacket  Eye Contact:  Good  Speech:  Clear and Coherent and Normal Rate  Volume:  Normal  Mood:  Described as "normal" - appears calm and more euthymic  Affect: moderated, stable - less guarded  Thought Process:  superficially goal directed and more linear  Orientation:  Full (Time, Place, and Person)  Thought Content:  Denies AVH, ideas of reference, paranoia, delusions, or first rank symptoms; no evidence of acute psychosis, delusions, or paranoia on exam  Suicidal Thoughts: Denied  Homicidal Thoughts:  Denied  Memory:  Recent;   Fair  Judgement:  Fair  Insight:  Fair  Psychomotor Activity:  Normal, no tremor, no cogwheeling or stiffness; AIMS 0 today  Concentration:  Concentration: Fair and Attention Span:  Fair  Recall:  [employer] of Knowledge:  Fair  Language:  Good  Akathisia:  Negative  Assets:  Communication Skills Desire for Improvement Resilience  ADL's:  Intact  Cognition:  WNL  Sleep:  Number of Hours: 5.5   Treatment Plan Summary: ASSESSMENT: Diagnoses / Active Problems: Unspecified schizophrenia spectrum and other psychotic d/o (r/o schizophreniform d/o, r/o MDD with psychotic features; r/o bipolar d/o with psychotic features, r/o substance induced psychotic d/o, r/o psychotic d/o secondary to a general medical condition) 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 Safety and Monitoring:  -- Voluntary admission to inpatient psychiatric unit for safety, stabilization and treatment  -- Daily contact with patient to assess and evaluate symptoms and progress in treatment  -- Patient's case to be discussed in multi-disciplinary team meeting  -- Observation Level : q15 minute checks  -- Vital signs:  q12 hours  -- Precautions: suicide  2. Psychiatric Diagnoses and Treatment:   Unspecified schizophrenia spectrum and other psychotic d/o (r/o schizophreniform d/o, r/o MDD with psychotic features; r/o bipolar d/o with psychotic features, r/o substance induced psychotic d/o, r/o psychotic d/o secondary to a general medical condition) -- Continue Risperdal 56m po qhs   -- Continue Prozac 147mqam   -- Discontinued Trazodone due to c/o nightmares - continue Melatonin 57m38mo qhs PRN insomnia  -- Collateral from mother obtained by social work reviewed which is suggestive that this may be a substance induced psychosis. Synthetic THC will not show in his UDS but he denies recent drug use. We will continue to monitor to see if he clears as potential substances are metabolized in his system and with start of medications.  -- Metabolic profile and EKG monitoring obtained while on an atypical antipsychotic (BMI: 23.73; Lipid Panel: cholesterol 119, HDL 61, LDL 52, triglycerides 29;  HbgA1c:5.2 QTc:438)   -- New onset psychosis w/u to r/o an organic etiology to symptoms: (TSH 2.330, WBC 5.1, AST 31, ALT 33, UDS positive for amphetamines after overdose, B12 511, ESR 6, HIV nonreactive, RPR nonreactive, ceruloplasmin 20.9, heavy metal panel negative) Head CT noncontrast and  ANA pending  -- Encouraged patient to participate in unit milieu and in scheduled group therapies   -- Attempting to get collateral from family with his signed consent  --  Short Term Goals: Ability to identify changes in lifestyle to reduce recurrence of condition will improve, Ability to demonstrate self-control will improve and Ability to identify and develop effective coping behaviors will improve  -- Long Term Goals: Improvement in symptoms so as ready for discharge   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 Discharge Planning:   -- Social work and case management to assist with discharge planning and identification of hospital follow-up needs prior to discharge  -- Estimated LOS: 2-3 days  -- Discharge Concerns: Need to establish a safety plan; Medication compliance and effectiveness  -- Discharge Goals: Return home with outpatient referrals for mental health follow-up including medication management/psychotherapy  Harlow Asa, MD, FAPA 03/27/2020, 7:42 AM

## 2020-03-27 NOTE — Progress Notes (Signed)
Psychoeducational Group Note  Date:  03/27/2020 Time:  2233  Group Topic/Focus:  Wrap-Up Group:   The focus of this group is to help patients review their daily goal of treatment and discuss progress on daily workbooks.  Participation Level: Did Not Attend  Participation Quality:  Not Applicable  Affect:  Not Applicable  Cognitive:  Not Applicable  Insight:  Not Applicable  Engagement in Group: Not Applicable  Additional Comments:  The patient did not attend group this evening.   Hazle Coca S 03/27/2020, 10:33 PM

## 2020-03-27 NOTE — [facility] Counselor (Signed)
CSW contacted pt's mother, Ashely Joshua [phone], to inquire of where pt will live when he is discharged from [facility]. Suzette Battiest stated that pt will come to live with her at discharge as he was before and that she will pick him up. CSW informed mother that pt's current projected discharge date is Wednesday. Suzette Battiest stated she is ready to pick up pt if he is discharged sooner and asked CSW to let the doctor know. CSW informed Dr. Mason Jim.   Fredirick Lathe, LCSWA Clinicial Social Worker [employer]

## 2020-03-27 NOTE — [facility] Group Notes (Signed)
Occupational Therapy Group Note Date: 03/27/2020 Group Topic/Focus: Socialization/Social Skills  Group Description: Group encouraged increased engagement and participation through discussion focused on "Breaking Down Barriers." Patients were given a handout to fill out and engage in a structured discussion around barrier to treatment including grief, guilt, substance use, etc. Discussion focused on strategies to combat barriers identified.   Participation Level: Patient did not attend OT group session despite personal invitation.    Plan: Continue to engage patient in OT groups 2 - 3x/week.  03/27/2020  Ilia Dimaano, MOT, OTR/L  

## 2020-03-27 NOTE — [facility] Group Notes (Signed)
The focus of this group is to help patients establish daily goals to achieve during treatment and discuss how the patient can incorporate goal setting into their daily lives to aide in recovery.   Patient attended and contributed to group. 

## 2020-03-27 NOTE — Progress Notes (Signed)
Pt has been isolative to his room and didn't attend group. Encouraged pt to stay out of his room as much as possible and interact with his peers. Pt denies any paranoia, racing thoughts, or delusions. Pt said that he did attend groups earlier during the day one which was about grieving. Pt said that he has been having trouble with his attention and concentration. He denies any withdrawal symptoms and has been pleasant on the unit. Pt appears preoccupied. Pt denies SI/HI and AVH. Active listening, reassurance, and support provided. Medications administered as ordered by provider. Q 15 min safety checks continue. Pt's safety has been maintained.   03/27/20 2111  Psych Admission Type (Psych Patients Only)  Admission Status Voluntary  Psychosocial Assessment  Patient Complaints Decreased concentration;Isolation  Eye Contact Brief  Facial Expression Flat;Anxious  Affect Anxious;Appropriate to circumstance;Flat  Speech Logical/coherent  Interaction Guarded;Forwards little;Minimal;Isolative  Motor Activity Other (Comment) (WDL)  Appearance/Hygiene Unremarkable  Behavior Characteristics Cooperative;Appropriate to situation;Anxious;Guarded  Mood Depressed;Anxious;Preoccupied;Pleasant  Thought Administrator, sports thinking  Content Preoccupation  Delusions None reported or observed  Perception WDL  Hallucination None reported or observed  Judgment Poor  Confusion None  Danger to Self  Current suicidal ideation? Denies  Self-Injurious Behavior No self-injurious ideation or behavior indicators observed or expressed   Danger to Others  Danger to Others None reported or observed

## 2020-03-28 MED ORDER — HYDROXYZINE HCL 25 MG PO TABS: 25.0000 mg | ORAL_TABLET | Freq: Three times a day (TID) | ORAL | 0 refills | Status: AC | PRN

## 2020-03-28 MED ORDER — RISPERIDONE 1 MG PO TABS: 1.0000 mg | ORAL_TABLET | Freq: Every day | ORAL | 0 refills | Status: AC

## 2020-03-28 MED ORDER — FLUOXETINE HCL 10 MG PO CAPS: 10.0000 mg | ORAL_CAPSULE | Freq: Every day | ORAL | 0 refills | Status: AC

## 2020-03-28 NOTE — Progress Notes (Signed)
  [facility] Adult Case Management Discharge Plan :  Will you be returning to the same living situation after discharge:  Yes,  mother's home At discharge, do you have transportation home?: Yes,  via mother Do you have the ability to pay for your medications: Yes,  has insurance  Release of information consent forms completed and in the chart;  Patient's signature needed at discharge.  Patient to Follow up at:  Follow-up Information    [place] Go to.   Specialty: Professional Counselor Why: For patients who have not been seen in the last 90 days must use walk in services to re-establish with this provider for therapy services.  Walk in hours are on Monday through Friday, 8:30 am to 12:00 pm and 1:00 pm to 2:30 pm.   Contact information: [address] [place] [address] [phone]        [facility] Go on 04/24/2020.   Why: You have an appointment for medication management services on 04/24/20  at 2:00 pm .  This appointment will be held in person.  Contact information: [address] [place] [address] [phone]               Next level of care provider has access to [facility] Link:no  Safety Planning and Suicide Prevention discussed: Yes,  with mother  Have you used any form of tobacco in the last 30 days? (Cigarettes, Smokeless Tobacco, Cigars, and/or Pipes): No  Has patient been referred to the Quitline?: N/A patient is not a smoker  Patient has been referred for addiction treatment: N/A  Felizardo Hoffmann, LCSWA 03/28/2020, 9:00 AM

## 2020-03-28 NOTE — Progress Notes (Signed)
Faith is educated about follow up care, upcoming appointments reviewed. He verbalizes understanding of all follow up appointments. He reports that he is already connected with a therapist and therefore does not anticipate seeking services from [facility] as recommended in AVS. Patient expresses no concerns or questions at this time. Educated on prescriptions and medication regimen. Patient belongings returned. Patient denies SI, HI, AVH at this time. Educated patient about suicide help resources and hotline, encouraged to call for assistance in the event of a crisis. Patient agrees. Patient is ambulatory and safe at time of discharge. Patient discharged to hospital lobby at this time. Mother present in lobby at time of departure.

## 2020-03-28 NOTE — Progress Notes (Signed)
   03/28/20 0636  Vital Signs  Temp 97.9 F (36.6 C)  Pulse Rate 72  BP 116/79  BP Location Right Arm  BP Method Automatic  Patient Position (if appropriate) Sitting   D: Patient denies SI/HI/AH. Patient denies anxiety and depression.  A:  Patient took scheduled medicine.  Support and encouragement provided Routine safety checks conducted every 15 minutes. Patient  Informed to notify staff with any concerns.   R: Safety maintained.
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
## 2020-03-28 NOTE — BHH Group Notes (Signed)
  Type of Therapy and Topic:  Group Therapy - Healthy vs Unhealthy Coping Skills  Participation Level:  Active   Description of Group The focus of this group was to determine what unhealthy coping techniques typically are used by group members and what healthy coping techniques would be helpful in coping with various problems. Patients were guided in becoming aware of the differences between healthy and unhealthy coping techniques. Patients were asked to identify 2-3 healthy coping skills they would like to learn to use more effectively.  Therapeutic Goals 1. Patients learned that coping is what human beings do all day long to deal with various situations in their lives 2. Patients defined and discussed healthy vs unhealthy coping techniques 3. Patients identified their preferred coping techniques and identified whether these were healthy or unhealthy 4. Patients determined 2-3 healthy coping skills they would like to become more familiar with and use more often. 5. Patients provided support and ideas to each other  Therapeutic Modalities Cognitive Behavioral Therapy Motivational Interviewing  Zyire Eidson, LCSWA Clinicial Social Worker [facility] 

## 2020-03-28 NOTE — BHH Suicide Risk Assessment (Signed)
[facility] Discharge Suicide Risk Assessment   Principal Problem: Schizophrenia spectrum disorder with psychotic disorder type not yet determined [facility]) Discharge Diagnoses: Principal Problem:   Schizophrenia spectrum disorder with psychotic disorder type not yet determined (HCC) Active Problems:   Episodic cannabis use   Total Time spent with patient: 20 minutes  Musculoskeletal: Strength & Muscle Tone: within normal limits Gait & Station: normal Patient leans: N/A  Psychiatric Specialty Exam: Review of Systems  Blood pressure 116/79, pulse 72, temperature 97.9 F (36.6 C), resp. rate 16, height 6' (1.829 m), weight 79.4 kg, SpO2 100 %.Body mass index is 23.73 kg/m.  General Appearance: Casual  Eye Contact::  Good  Speech:  Clear and Coherent409  Volume:  Normal  Mood:  Euthymic  Affect:  Appropriate  Thought Process:  Coherent  Orientation:  Full (Time, Place, and Person)  Thought Content:  Logical  Suicidal Thoughts:  No  Homicidal Thoughts:  No  Memory:  Recent;   Fair  Judgement:  Fair  Insight:  Fair  Psychomotor Activity:  Normal  Concentration:  Fair  Recall:  [employer] of Knowledge:Fair  Language: Fair  Akathisia:  No  Handed:  Right  AIMS (if indicated):     Assets:  Desire for Improvement Leisure Time Physical Health Social Support Talents/Skills  Sleep:  Number of Hours: 6.25  Cognition: WNL  ADL's:  Intact   Mental Status Per Nursing Assessment::   On Admission:  Suicidal ideation indicated by patient  Demographic Factors:  Male and Adolescent or young adult  Loss Factors: NA  Historical Factors: Impulsivity  Risk Reduction Factors:   Living with another person, especially a relative, Positive social support, Positive therapeutic relationship and Positive coping skills or problem solving skills  Continued Clinical Symptoms:  Alcohol/Substance Abuse/Dependencies  Cognitive Features That Contribute To Risk:  None    Suicide Risk:  Mild:   Suicidal ideation of limited frequency, intensity, duration, and specificity.  There are no identifiable plans, no associated intent, mild dysphoria and related symptoms, good self-control (both objective and subjective assessment), few other risk factors, and identifiable protective factors, including available and accessible social support.   Follow-up Information    [place] Go to.   Specialty: Professional Counselor Why: For patients who have not been seen in the last 90 days must use walk in services to re-establish with this provider for therapy services.  Walk in hours are on Monday through Friday, 8:30 am to 12:00 pm and 1:00 pm to 2:30 pm.   Contact information: [address] [place] [address] [phone]        [facility] Go on 04/24/2020.   Why: You have an appointment for medication management services on 04/24/20  at 2:00 pm .  This appointment will be held in person.  Contact information: [address] [place] [address] [phone]               Plan Of Care/Follow-up recommendations:  Other:  Follow-up with outpatient care  Clement Sayres, MD 03/28/2020, 11:21 AM

## 2020-03-29 NOTE — Progress Notes (Signed)
Spirituality group facilitated by Wilkie Aye, MDiv, BCC.  Group Description:  Group focused on topic of hope.  Patients participated in facilitated discussion around topic, connecting with one another around experiences and definitions for hope.  Group members engaged with visual explorer photos, reflecting on what hope looks like for them today.  Group engaged in discussion around how their definitions of hope are present today in hospital.   Modalities: Psycho-social ed, Adlerian, Narrative, MI Patient Progress: Pt was present during group  Pt spoke about having a routine  He also spoke about his thoughts get jumbles up in his head and so if he gets it our on paper or a google doc that helps him   Leane Para  Counseling Intern @ [facility]

## 2021-01-30 ENCOUNTER — Ambulatory Visit (HOSPITAL_COMMUNITY)
Admission: RE | Admit: 2021-01-30 | Discharge: 2021-01-30 | Disposition: A | Discharge status: Home / Self Care | Payer: No Typology Code available for payment source | Attending: Psychiatry | Admitting: Psychiatry
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
## 2022-01-24 ENCOUNTER — Ambulatory Visit (HOSPITAL_COMMUNITY)
Admission: EM | Admit: 2022-01-24 | Discharge: 2022-01-24 | Disposition: A | Discharge status: Home / Self Care | Payer: 59 | Attending: Family Medicine | Admitting: Family Medicine

## 2022-01-24 ENCOUNTER — Other Ambulatory Visit: Payer: Self-pay

## 2022-01-24 ENCOUNTER — Encounter (HOSPITAL_COMMUNITY): Payer: Self-pay | Admitting: *Deleted

## 2022-01-24 ENCOUNTER — Emergency Department (HOSPITAL_COMMUNITY)
Admission: EM | Admit: 2022-01-24 | Discharge: 2022-01-24 | Discharge status: Against Medical Advice | Payer: 59 | Attending: Physician Assistant | Admitting: Physician Assistant

## 2022-01-24 ENCOUNTER — Encounter (HOSPITAL_COMMUNITY): Payer: Self-pay

## 2022-01-24 DIAGNOSIS — Z5321 Procedure and treatment not carried out due to patient leaving prior to being seen by health care provider: Secondary | ICD-10-CM | POA: Insufficient documentation

## 2022-01-24 DIAGNOSIS — R002 Palpitations: Secondary | ICD-10-CM | POA: Insufficient documentation

## 2022-01-24 DIAGNOSIS — R55 Syncope and collapse: Secondary | ICD-10-CM

## 2022-01-24 HISTORY — DX: Attention-deficit hyperactivity disorder, unspecified type: F90.9

## 2022-01-24 LAB — CBC WITH DIFFERENTIAL/PLATELET
Abs Immature Granulocytes: 0.01 10*3/uL (ref 0.00–0.07)
Basophils Absolute: 0 10*3/uL (ref 0.0–0.1)
Basophils Relative: 1 %
Eosinophils Absolute: 0.1 10*3/uL (ref 0.0–0.5)
Eosinophils Relative: 1 %
HCT: 43.8 % (ref 39.0–52.0)
Hemoglobin: 14.9 g/dL (ref 13.0–17.0)
Immature Granulocytes: 0 %
Lymphocytes Relative: 36 %
Lymphs Abs: 1.4 10*3/uL (ref 0.7–4.0)
MCH: 29.3 pg (ref 26.0–34.0)
MCHC: 34 g/dL (ref 30.0–36.0)
MCV: 86.1 fL (ref 80.0–100.0)
Monocytes Absolute: 0.4 10*3/uL (ref 0.1–1.0)
Monocytes Relative: 10 %
Neutro Abs: 2 10*3/uL (ref 1.7–7.7)
Neutrophils Relative %: 52 %
Platelets: 209 10*3/uL (ref 150–400)
RBC: 5.09 MIL/uL (ref 4.22–5.81)
RDW: 13.5 % (ref 11.5–15.5)
WBC: 3.8 10*3/uL — ABNORMAL LOW (ref 4.0–10.5)
nRBC: 0 % (ref 0.0–0.2)

## 2022-01-24 LAB — COMPREHENSIVE METABOLIC PANEL
ALT: 28 U/L (ref 0–44)
AST: 27 U/L (ref 15–41)
Albumin: 4.4 g/dL (ref 3.5–5.0)
Alkaline Phosphatase: 50 U/L (ref 38–126)
Anion gap: 7 (ref 5–15)
BUN: 12 mg/dL (ref 6–20)
CO2: 29 mmol/L (ref 22–32)
Calcium: 9.7 mg/dL (ref 8.9–10.3)
Chloride: 103 mmol/L (ref 98–111)
Creatinine, Ser: 1.05 mg/dL (ref 0.61–1.24)
GFR, Estimated: >60 mL/min (ref >60)
Glucose, Bld: 89 mg/dL (ref 70–99)
Potassium: 4 mmol/L (ref 3.5–5.1)
Sodium: 139 mmol/L (ref 135–145)
Total Bilirubin: 1 mg/dL (ref 0.3–1.2)
Total Protein: 7.4 g/dL (ref 6.5–8.1)

## 2022-01-24 LAB — MAGNESIUM: Magnesium: 2.2 mg/dL (ref 1.7–2.4)

## 2022-01-24 LAB — TROPONIN I (HIGH SENSITIVITY): Troponin I (High Sensitivity): <2 ng/L (ref <18)

## 2022-01-24 NOTE — ED Triage Notes (Signed)
Palpitations and twitching for several weeks.   This past Sunday reports a syncopal episode. Denies LOC though saying he remembers entire event. Has been fasting a lot and has hx of anxiety attacks.   Went to UC and had EKG done. Encouraged to ED for further testing.

## 2022-01-24 NOTE — ED Provider Triage Note (Signed)
Emergency Medicine Provider Triage Evaluation Note  Jeffrey [name] , a 21 y.o. male  was evaluated in triage.  Pt complains of palpitations, episodes have been intermittent for about a week.  Reports he has a history of panic attacks and anxiety.  Went to urgent care as he was twitching of his hands which has happened before with his ADHD.  No improvement in his symptoms also concerning for chest pain.  No prior history of CAD, no family history of CAD, no tobacco use.  Review of Systems  Positive: Palpitations, Chest pain, syncope Negative: fever  Physical Exam  BP (!) 117/98 (BP Location: Right Arm)   Pulse 72   Temp 99.4 F (37.4 C) (Oral)   Resp 18   Ht 6' (1.829 m)   Wt 79.4 kg   SpO2 100%   BMI 23.73 kg/m  Gen:   Awake, no distress   Resp:  Normal effort  MSK:   Moves extremities without difficulty  Other:    Medical Decision Making  Medically screening exam initiated at 8:50 PM.  Appropriate orders placed.  [facility] was informed that the remainder of the evaluation will be completed by another provider, this initial triage assessment does not replace that evaluation, and the importance of remaining in the ED until their evaluation is complete.     Claude Manges, PA-C 01/24/22 2053

## 2022-01-24 NOTE — ED Notes (Signed)
Refused EKG

## 2022-01-24 NOTE — ED Notes (Signed)
Patient is being discharged from the Urgent Care and sent to the Emergency Department via private vehicle with family . Per P. Banister, MD, patient is in need of higher level of care due to syncopal episode, palpations. Patient is aware and verbalizes understanding of plan of care.  Vitals:   01/24/22 1735  BP: 132/71  Pulse: 79  Resp: 14  Temp: 98.3 F (36.8 C)  SpO2: 98%

## 2022-01-24 NOTE — ED Notes (Signed)
Returned identification labels and notified staff of leaving.

## 2022-01-24 NOTE — ED Triage Notes (Signed)
Pt states 4 days ago was twitching uncontrollably and had "very brief" syncopal episode at work; states "I just walked it off after that". C/O transient episodes of eyes twitching, heart palpitations and sensation of heart racing over past 2 weeks.

## 2022-01-24 NOTE — ED Provider Notes (Signed)
Patient seen briefly at triage.  He has been experiencing some palpitations and some muscle twitching "all over".  Recently he had a syncopal episode, though he states it was "brief".  He is no longer on ADHD medicine since several months ago.  EKG shows early repolarization but no serious pathology.  With the twitching and the syncope, I have asked him to present to the emergency room for further evaluation and a higher level of care than we can do in the urgent care setting   Zenia Resides, MD 01/24/22 1749

## 2022-04-23 ENCOUNTER — Other Ambulatory Visit (HOSPITAL_COMMUNITY): Payer: Self-pay

## 2022-04-23 DIAGNOSIS — F900 Attention-deficit hyperactivity disorder, predominantly inattentive type: Secondary | ICD-10-CM | POA: Diagnosis not present

## 2022-04-23 MED ORDER — METHYLPHENIDATE HCL ER (OSM) 27 MG PO TBCR
27.0000 mg | EXTENDED_RELEASE_TABLET | Freq: Every morning | ORAL | 0 refills | Status: AC
  Filled 2022-04-23: qty 30, 30d supply, fill #0

## 2022-05-27 DIAGNOSIS — R42 Dizziness and giddiness: Secondary | ICD-10-CM | POA: Diagnosis not present

## 2022-05-27 DIAGNOSIS — R55 Syncope and collapse: Secondary | ICD-10-CM | POA: Diagnosis not present

## 2022-05-27 DIAGNOSIS — F909 Attention-deficit hyperactivity disorder, unspecified type: Secondary | ICD-10-CM | POA: Diagnosis not present

## 2022-09-12 IMAGING — DX DG CHEST 1V PORT
1 series · 2 of 2 positions shown · non-contrast
Comparison: None.

CLINICAL DATA: Short of breath, fever, cough

EXAM:
PORTABLE CHEST 1 VIEW

[Series 1: chest · 0.14mm/px · 2 of 2 slices shown]
[im 1/2]
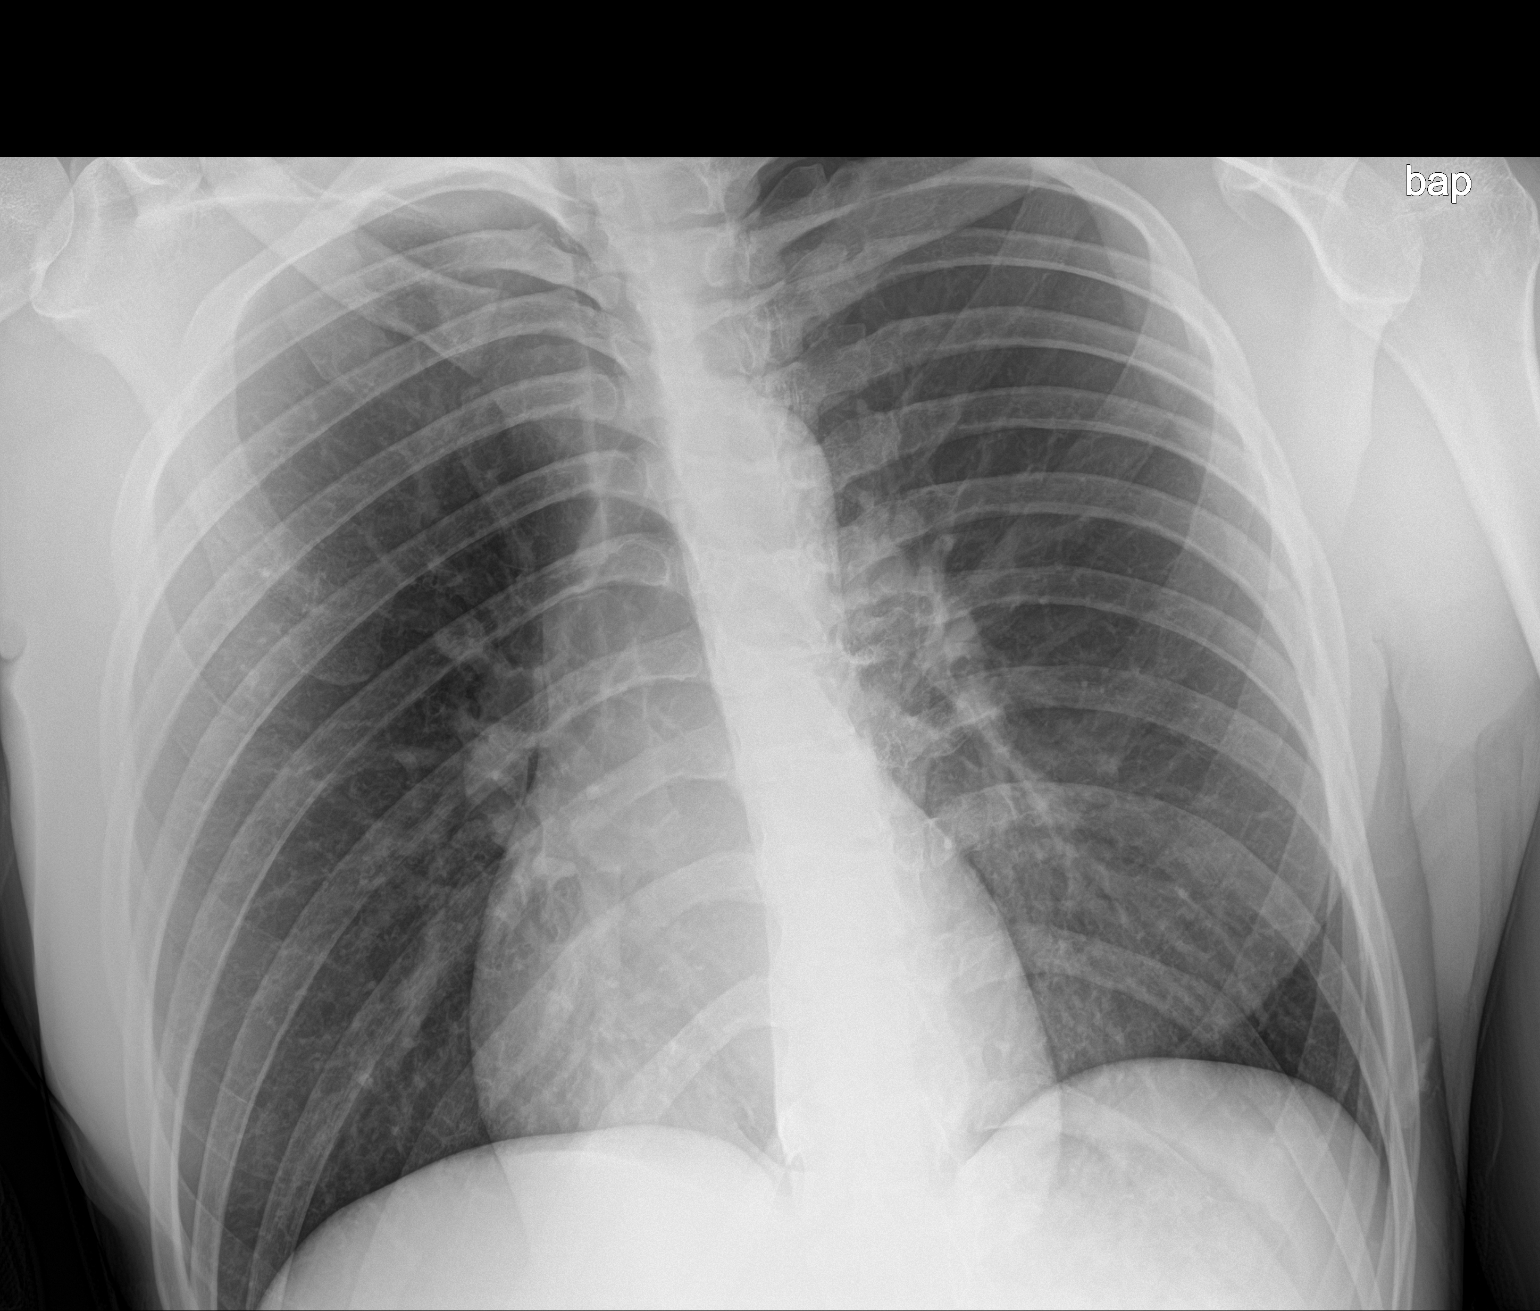
[im 2/2]
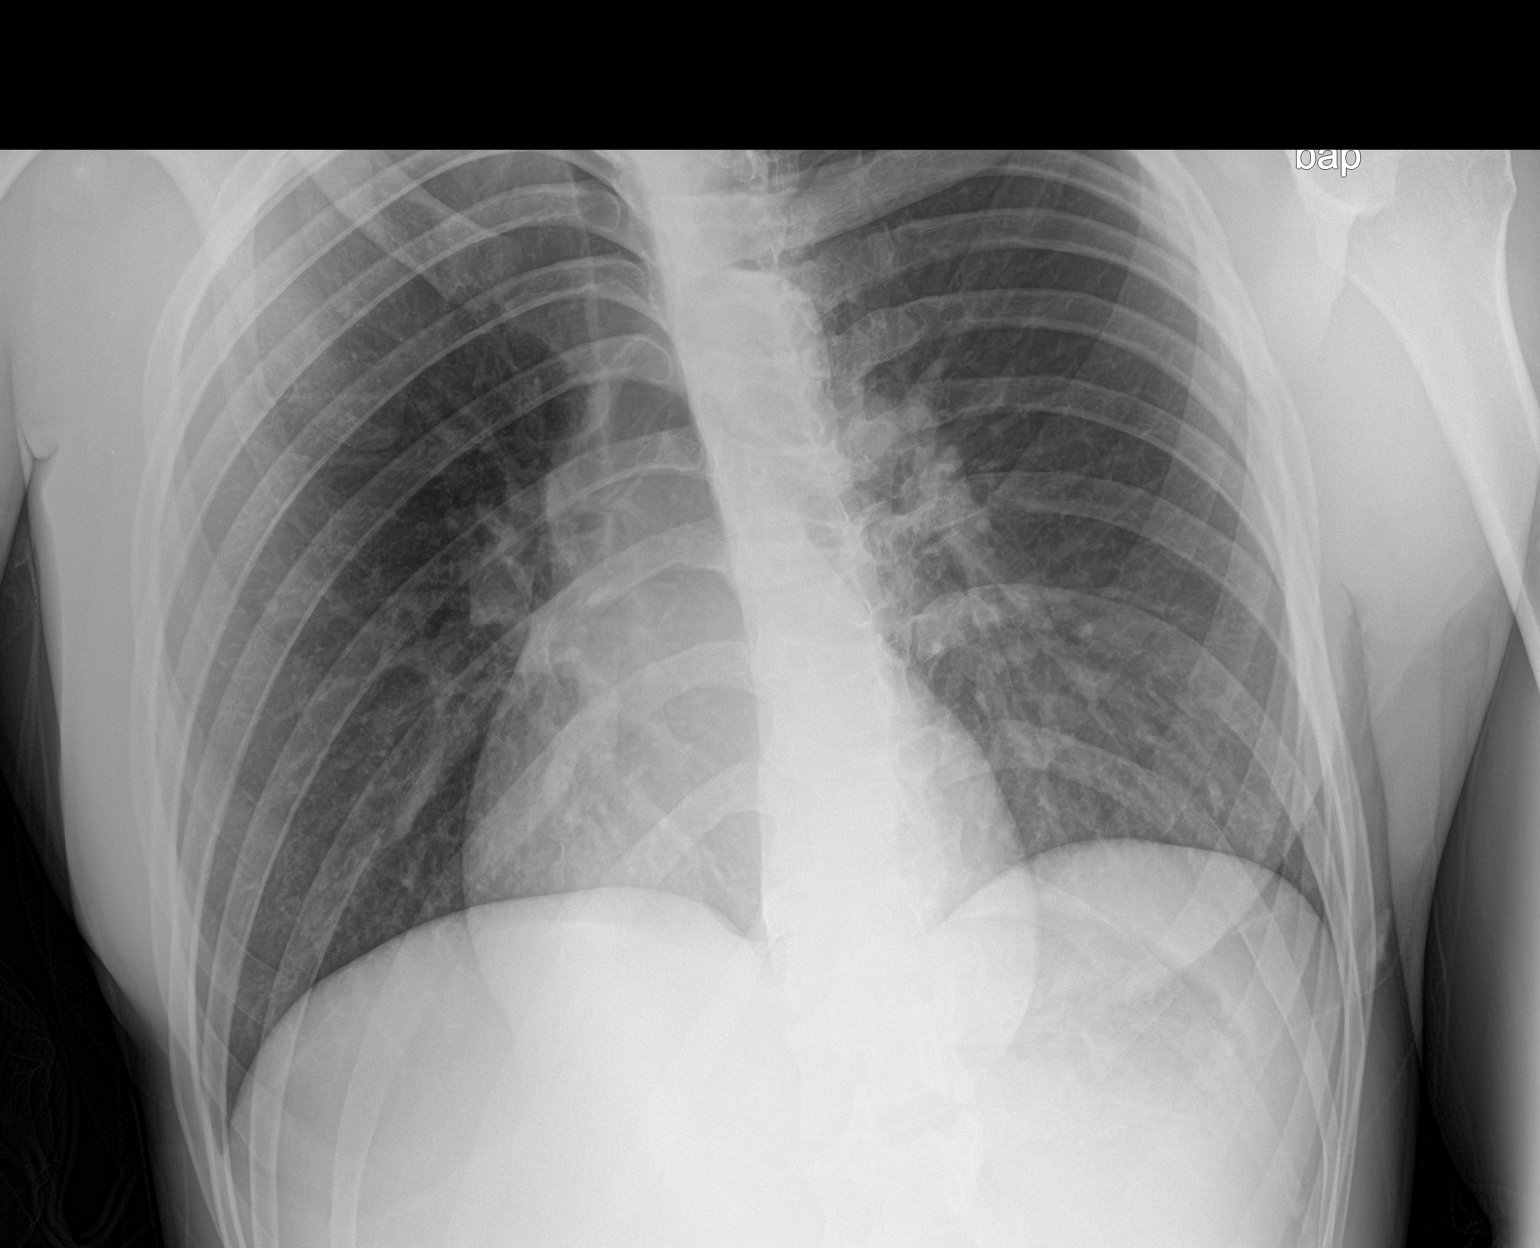

[2 of 2 positions shown; findings below may reference images not displayed]

FINDINGS: Two frontal views of the chest are obtained with the patient rotated
toward the right. Cardiac silhouette is unremarkable. No airspace
disease, effusion, or pneumothorax. No acute bony abnormalities.
IMPRESSION: 1. No acute intrathoracic process.

## 2023-02-05 DIAGNOSIS — Z20822 Contact with and (suspected) exposure to covid-19: Secondary | ICD-10-CM | POA: Diagnosis not present

## 2023-02-05 DIAGNOSIS — J029 Acute pharyngitis, unspecified: Secondary | ICD-10-CM | POA: Diagnosis not present

## 2023-02-05 DIAGNOSIS — R051 Acute cough: Secondary | ICD-10-CM | POA: Diagnosis not present

## 2023-02-25 ENCOUNTER — Other Ambulatory Visit (HOSPITAL_COMMUNITY): Payer: Self-pay

## 2023-05-21 DIAGNOSIS — B349 Viral infection, unspecified: Secondary | ICD-10-CM | POA: Diagnosis not present

## 2023-05-29 DIAGNOSIS — F900 Attention-deficit hyperactivity disorder, predominantly inattentive type: Secondary | ICD-10-CM | POA: Diagnosis not present

## 2023-09-02 ENCOUNTER — Other Ambulatory Visit (HOSPITAL_COMMUNITY): Payer: Self-pay

## 2023-09-02 MED ORDER — METHYLPHENIDATE HCL ER (OSM) 36 MG PO TBCR
36.0000 mg | EXTENDED_RELEASE_TABLET | ORAL | 0 refills | Status: DC
Start: 2023-09-02 — End: 2023-10-07
  Filled 2023-09-02: qty 30, 30d supply, fill #0

## 2023-10-07 ENCOUNTER — Other Ambulatory Visit (HOSPITAL_COMMUNITY): Payer: Self-pay

## 2023-10-07 MED ORDER — METHYLPHENIDATE HCL ER (OSM) 36 MG PO TBCR
36.0000 mg | EXTENDED_RELEASE_TABLET | Freq: Every morning | ORAL | 0 refills | Status: DC
Start: 2023-10-07 — End: 2023-11-07
  Filled 2023-10-07: qty 30, 30d supply, fill #0

## 2023-11-07 ENCOUNTER — Other Ambulatory Visit (HOSPITAL_COMMUNITY): Payer: Self-pay

## 2023-11-07 MED ORDER — METHYLPHENIDATE HCL ER (OSM) 36 MG PO TBCR
36.0000 mg | EXTENDED_RELEASE_TABLET | Freq: Every morning | ORAL | 0 refills | Status: AC
  Filled 2023-11-07: qty 30, 30d supply, fill #0

## 2024-03-07 ENCOUNTER — Encounter (HOSPITAL_COMMUNITY): Payer: Self-pay | Admitting: *Deleted

## 2024-03-07 ENCOUNTER — Emergency Department (HOSPITAL_COMMUNITY)

## 2024-03-07 ENCOUNTER — Emergency Department (HOSPITAL_COMMUNITY)
Admission: EM | Admit: 2024-03-07 | Discharge: 2024-03-07 | Disposition: A | Discharge status: Home / Self Care | Attending: Emergency Medicine | Admitting: Emergency Medicine

## 2024-03-07 ENCOUNTER — Other Ambulatory Visit (HOSPITAL_COMMUNITY): Payer: Self-pay

## 2024-03-07 ENCOUNTER — Other Ambulatory Visit: Payer: Self-pay

## 2024-03-07 DIAGNOSIS — R110 Nausea: Secondary | ICD-10-CM | POA: Diagnosis not present

## 2024-03-07 DIAGNOSIS — M791 Myalgia, unspecified site: Secondary | ICD-10-CM | POA: Diagnosis present

## 2024-03-07 DIAGNOSIS — R319 Hematuria, unspecified: Secondary | ICD-10-CM | POA: Diagnosis not present

## 2024-03-07 LAB — LIPASE, BLOOD: Lipase: 17 U/L (ref 11–51)

## 2024-03-07 LAB — CBC
HCT: 42.8 % (ref 39.0–52.0)
Hemoglobin: 14.8 g/dL (ref 13.0–17.0)
MCH: 28.8 pg (ref 26.0–34.0)
MCHC: 34.6 g/dL (ref 30.0–36.0)
MCV: 83.4 fL (ref 80.0–100.0)
Platelets: 178 10*3/uL (ref 150–400)
RBC: 5.13 MIL/uL (ref 4.22–5.81)
RDW: 13 % (ref 11.5–15.5)
WBC: 5.4 10*3/uL (ref 4.0–10.5)
nRBC: 0 % (ref 0.0–0.2)

## 2024-03-07 LAB — COMPREHENSIVE METABOLIC PANEL WITH GFR
ALT: 38 U/L (ref 0–44)
AST: 46 U/L — ABNORMAL HIGH (ref 15–41)
Albumin: 4.4 g/dL (ref 3.5–5.0)
Alkaline Phosphatase: 48 U/L (ref 38–126)
Anion gap: 12 (ref 5–15)
BUN: 20 mg/dL (ref 6–20)
CO2: 22 mmol/L (ref 22–32)
Calcium: 9.1 mg/dL (ref 8.9–10.3)
Chloride: 104 mmol/L (ref 98–111)
Creatinine, Ser: 1.15 mg/dL (ref 0.61–1.24)
GFR, Estimated: >60 mL/min
Glucose, Bld: 122 mg/dL — ABNORMAL HIGH (ref 70–99)
Potassium: 4 mmol/L (ref 3.5–5.1)
Sodium: 138 mmol/L (ref 135–145)
Total Bilirubin: 1.7 mg/dL — ABNORMAL HIGH (ref 0.0–1.2)
Total Protein: 7.2 g/dL (ref 6.5–8.1)

## 2024-03-07 LAB — URINALYSIS, ROUTINE W REFLEX MICROSCOPIC
Bilirubin Urine: NEGATIVE
Glucose, UA: NEGATIVE mg/dL
Hgb urine dipstick: NEGATIVE
Ketones, ur: 5 mg/dL — AB
Leukocytes,Ua: NEGATIVE
Nitrite: NEGATIVE
Protein, ur: NEGATIVE mg/dL
Specific Gravity, Urine: 1.028 (ref 1.005–1.030)
pH: 5 (ref 5.0–8.0)

## 2024-03-07 MED ORDER — TRAMADOL HCL 50 MG PO TABS
50.0000 mg | ORAL_TABLET | Freq: Once | ORAL | Status: AC
  Administered 2024-03-07: 50 mg via ORAL
  Filled 2024-03-07: qty 1

## 2024-03-07 MED ORDER — TRAMADOL HCL 50 MG PO TABS
50.0000 mg | ORAL_TABLET | Freq: Four times a day (QID) | ORAL | 0 refills | Status: AC | PRN
  Filled 2024-03-07: qty 10, 3d supply, fill #0

## 2024-03-07 NOTE — ED Notes (Signed)
 Patient transported to CT

## 2024-03-07 NOTE — ED Triage Notes (Signed)
 The pt arrived ambulatory from [facility] c/o hematuria for 2 weeks  he reports that he has been too busy to seek medical care     painful urination and flank pain

## 2024-03-07 NOTE — Discharge Instructions (Signed)
 As we discussed, follow up with your doctor by calling and making an appointment for recheck of your urine test in 1 week. You can take Tramadol  for pain as prescribed.

## 2024-03-08 ENCOUNTER — Other Ambulatory Visit (HOSPITAL_COMMUNITY): Payer: Self-pay
